# Patient Record
Sex: Male | Born: 1950 | Race: White | Hispanic: No | Smoking: Current every day smoker
Health system: Southern US, Community
[De-identification: ages and names within clinical notes are randomized; demographics above are authoritative.]

## PROBLEM LIST (undated history)

## (undated) DIAGNOSIS — I10 Essential (primary) hypertension: Secondary | ICD-10-CM

## (undated) DIAGNOSIS — E119 Type 2 diabetes mellitus without complications: Secondary | ICD-10-CM

## (undated) DIAGNOSIS — J449 Chronic obstructive pulmonary disease, unspecified: Secondary | ICD-10-CM

## (undated) HISTORY — PX: BRAIN SURGERY: SHX531

---

## 2018-11-28 ENCOUNTER — Encounter: Payer: Self-pay | Admitting: Emergency Medicine

## 2018-11-28 ENCOUNTER — Ambulatory Visit
Admission: EM | Admit: 2018-11-28 | Discharge: 2018-11-28 | Disposition: A | Discharge status: Home / Self Care | Payer: Medicare Other | Attending: Family Medicine | Admitting: Family Medicine

## 2018-11-28 ENCOUNTER — Other Ambulatory Visit: Payer: Self-pay

## 2018-11-28 DIAGNOSIS — H6122 Impacted cerumen, left ear: Secondary | ICD-10-CM | POA: Diagnosis not present

## 2018-11-28 HISTORY — DX: Type 2 diabetes mellitus without complications: E11.9

## 2018-11-28 HISTORY — DX: Essential (primary) hypertension: I10

## 2018-11-28 NOTE — ED Triage Notes (Signed)
Pt c/o dizziness and ear fullness. He states that he has known balance issues but he has been more "foggy" for the last 8 days and he is thinking it is related to his ears because they feel full.

## 2018-11-28 NOTE — Discharge Instructions (Signed)
Debrox 1-2 times/week.  Continue your home medications.  Take care  Dr. Adriana Simas

## 2018-11-28 NOTE — ED Provider Notes (Signed)
[facility]    CSN: [id number] Arrival date & time: 11/28/18  1318  History   Chief Complaint Chief Complaint  Patient presents with  . Ear Fullness    bilateral  . Dizziness   HPI  68 year old male presents with the above complaints.  Patient reports that past week he has had ongoing issues with his balance.  Patient states that he feels unstable on his feet.  He normally uses a cane.  [place]  He called his primary care provider and was instructed to hold his metformin and decrease his lisinopril.  He states that this has not resolved his problem.  He states that he has had ear fullness and difficulty hearing.  He has a history of cerumen impaction.  He believes this is the culprit of his problem.  No relieving factors.  Seems to be worse with movement.  No reports of nausea vomiting.  No other associated symptoms.  No other complaints.  History reviewed as below. Past Medical History:  Diagnosis Date  . Diabetes mellitus without complication (HCC)   . Hypertension   Tobacco abuse HLD  Home Medications    Prior to Admission medications   Medication Sig Start Date End Date Taking? Authorizing Provider  aspirin EC 81 MG tablet Take 81 mg by mouth daily.   Yes [provider]  lisinopril (ZESTRIL) 10 MG tablet Take 10 mg by mouth daily.   Yes [provider]  metFORMIN (GLUCOPHAGE) 500 MG tablet Take by mouth daily with breakfast.   Yes [provider]  simvastatin (ZOCOR) 20 MG tablet Take 20 mg by mouth daily.   Yes [provider]   Social History Social History   Tobacco Use  . Smoking status: Current Every Day Smoker    Packs/day: 0.50  . Smokeless tobacco: Never Used  Substance Use Topics  . Alcohol use: Yes    Comment: socally  . Drug use: Not Currently   Allergies   Patient has no known allergies.  Review of Systems Review of Systems  HENT: Positive for hearing loss.         Ear fullness.   Neurological:       Off balance.   Physical Exam Triage Vital Signs ED Triage Vitals  Enc Vitals Group     BP 11/28/18 1340 105/83     Pulse Rate 11/28/18 1340 74     Resp 11/28/18 1340 18     Temp 11/28/18 1340 98.5 F (36.9 C)     Temp Source 11/28/18 1340 Oral     SpO2 11/28/18 1340 94 %     Weight 11/28/18 1333 175 lb (79.4 kg)     Height 11/28/18 1333 5\' 9"  (1.753 m)     Head Circumference --      Peak Flow --      Pain Score 11/28/18 1333 0     Pain Loc --      Pain Edu? --      Excl. in GC? --    Updated Vital Signs BP 105/83 (BP Location: Left Arm)   Pulse 74   Temp 98.5 F (36.9 C) (Oral)   Resp 18   Ht 5\' 9"  (1.753 m)   Wt 79.4 kg   SpO2 94%   BMI 25.84 kg/m   Visual Acuity Right Eye Distance:   Left Eye Distance:   Bilateral Distance:    Right Eye Near:   Left Eye  Near:    Bilateral Near:     Physical Exam Vitals signs and nursing note reviewed.  Constitutional:      General: He is not in acute distress.    Appearance: Normal appearance.  HENT:     Head: Normocephalic and atraumatic.     Ears:     Comments: Left ear - cerumen impaction noted. Eyes:     General:        Right eye: No discharge.        Left eye: No discharge.     Conjunctiva/sclera: Conjunctivae normal.  Pulmonary:     Effort: Pulmonary effort is normal. No respiratory distress.  Neurological:     Mental Status: He is alert.  Psychiatric:        Mood and Affect: Mood normal.        Behavior: Behavior normal.    UC Treatments / Results  Labs (all labs ordered are listed, but only abnormal results are displayed) Labs Reviewed - No data to display  EKG None  Radiology No results found.  Procedures Procedures (including critical care time)  Medications Ordered in UC Medications - No data to display  Initial Impression / Assessment and Plan / UC Course  I have reviewed the triage vital signs and the nursing notes.  Pertinent labs &  imaging results that were available during my care of the patient were reviewed by me and considered in my medical decision making (see chart for details).    68 year old male presents with cerumen impaction.  Irrigation performed today with success.  Over-the-counter Debrox as directed.  Continue home medications.  Supportive care.  Final Clinical Impressions(s) / UC Diagnoses   Final diagnoses:  Impacted cerumen of left ear     Discharge Instructions     Debrox 1-2 times/week.  Continue your home medications.  Take care  Dr. Adriana Simas    ED Prescriptions    None     Controlled Substance Prescriptions [place] Controlled Substance Registry consulted? Not Applicable   Tommie Sams, DO 11/28/18 1504

## 2019-08-12 DIAGNOSIS — E785 Hyperlipidemia, unspecified: Secondary | ICD-10-CM | POA: Insufficient documentation

## 2019-08-12 DIAGNOSIS — E1169 Type 2 diabetes mellitus with other specified complication: Secondary | ICD-10-CM | POA: Insufficient documentation

## 2021-04-22 ENCOUNTER — Other Ambulatory Visit: Payer: Self-pay | Admitting: Specialist

## 2021-04-22 ENCOUNTER — Other Ambulatory Visit (HOSPITAL_BASED_OUTPATIENT_CLINIC_OR_DEPARTMENT_OTHER): Payer: Self-pay | Admitting: Specialist

## 2021-04-22 DIAGNOSIS — R053 Chronic cough: Secondary | ICD-10-CM

## 2021-04-22 DIAGNOSIS — J449 Chronic obstructive pulmonary disease, unspecified: Secondary | ICD-10-CM

## 2021-04-22 DIAGNOSIS — R0602 Shortness of breath: Secondary | ICD-10-CM

## 2021-05-11 ENCOUNTER — Ambulatory Visit
Admission: RE | Admit: 2021-05-11 | Discharge: 2021-05-11 | Disposition: A | Discharge status: Home / Self Care | Payer: Medicare Other | Source: Ambulatory Visit | Attending: Specialist | Admitting: Specialist

## 2021-05-11 ENCOUNTER — Other Ambulatory Visit: Payer: Self-pay

## 2021-05-11 DIAGNOSIS — J449 Chronic obstructive pulmonary disease, unspecified: Secondary | ICD-10-CM | POA: Diagnosis present

## 2021-05-11 DIAGNOSIS — R053 Chronic cough: Secondary | ICD-10-CM | POA: Diagnosis not present

## 2021-05-11 DIAGNOSIS — R0602 Shortness of breath: Secondary | ICD-10-CM | POA: Insufficient documentation

## 2021-07-31 ENCOUNTER — Inpatient Hospital Stay: Payer: Medicare Other

## 2021-07-31 ENCOUNTER — Emergency Department: Payer: Medicare Other

## 2021-07-31 ENCOUNTER — Inpatient Hospital Stay
Admission: EM | Admit: 2021-07-31 | Discharge: 2021-08-04 | DRG: 871 | Disposition: A | Discharge status: Home Health | Payer: Medicare Other | Attending: Internal Medicine | Admitting: Internal Medicine

## 2021-07-31 ENCOUNTER — Other Ambulatory Visit: Payer: Self-pay

## 2021-07-31 DIAGNOSIS — I248 Other forms of acute ischemic heart disease: Secondary | ICD-10-CM | POA: Diagnosis present

## 2021-07-31 DIAGNOSIS — Z66 Do not resuscitate: Secondary | ICD-10-CM | POA: Diagnosis present

## 2021-07-31 DIAGNOSIS — Z79899 Other long term (current) drug therapy: Secondary | ICD-10-CM | POA: Diagnosis not present

## 2021-07-31 DIAGNOSIS — A419 Sepsis, unspecified organism: Secondary | ICD-10-CM | POA: Diagnosis not present

## 2021-07-31 DIAGNOSIS — I2510 Atherosclerotic heart disease of native coronary artery without angina pectoris: Secondary | ICD-10-CM | POA: Diagnosis present

## 2021-07-31 DIAGNOSIS — R0602 Shortness of breath: Secondary | ICD-10-CM

## 2021-07-31 DIAGNOSIS — J9601 Acute respiratory failure with hypoxia: Secondary | ICD-10-CM | POA: Diagnosis present

## 2021-07-31 DIAGNOSIS — T380X5A Adverse effect of glucocorticoids and synthetic analogues, initial encounter: Secondary | ICD-10-CM | POA: Diagnosis present

## 2021-07-31 DIAGNOSIS — J441 Chronic obstructive pulmonary disease with (acute) exacerbation: Secondary | ICD-10-CM | POA: Diagnosis not present

## 2021-07-31 DIAGNOSIS — J189 Pneumonia, unspecified organism: Secondary | ICD-10-CM

## 2021-07-31 DIAGNOSIS — Z7984 Long term (current) use of oral hypoglycemic drugs: Secondary | ICD-10-CM

## 2021-07-31 DIAGNOSIS — Y92239 Unspecified place in hospital as the place of occurrence of the external cause: Secondary | ICD-10-CM | POA: Diagnosis present

## 2021-07-31 DIAGNOSIS — Z20822 Contact with and (suspected) exposure to covid-19: Secondary | ICD-10-CM | POA: Diagnosis present

## 2021-07-31 DIAGNOSIS — Z8616 Personal history of COVID-19: Secondary | ICD-10-CM

## 2021-07-31 DIAGNOSIS — J432 Centrilobular emphysema: Secondary | ICD-10-CM | POA: Diagnosis present

## 2021-07-31 DIAGNOSIS — I10 Essential (primary) hypertension: Secondary | ICD-10-CM | POA: Diagnosis present

## 2021-07-31 DIAGNOSIS — R7989 Other specified abnormal findings of blood chemistry: Secondary | ICD-10-CM | POA: Diagnosis present

## 2021-07-31 DIAGNOSIS — E119 Type 2 diabetes mellitus without complications: Secondary | ICD-10-CM | POA: Diagnosis present

## 2021-07-31 DIAGNOSIS — D72829 Elevated white blood cell count, unspecified: Secondary | ICD-10-CM | POA: Diagnosis not present

## 2021-07-31 DIAGNOSIS — R778 Other specified abnormalities of plasma proteins: Secondary | ICD-10-CM | POA: Diagnosis present

## 2021-07-31 DIAGNOSIS — F17200 Nicotine dependence, unspecified, uncomplicated: Secondary | ICD-10-CM | POA: Diagnosis present

## 2021-07-31 DIAGNOSIS — F17210 Nicotine dependence, cigarettes, uncomplicated: Secondary | ICD-10-CM | POA: Diagnosis present

## 2021-07-31 HISTORY — DX: Type 2 diabetes mellitus without complications: E11.9

## 2021-07-31 HISTORY — DX: Chronic obstructive pulmonary disease, unspecified: J44.9

## 2021-07-31 HISTORY — DX: Essential (primary) hypertension: I10

## 2021-07-31 LAB — BASIC METABOLIC PANEL
Anion gap: 9 (ref 5–15)
BUN: 15 mg/dL (ref 8–23)
CO2: 24 mmol/L (ref 22–32)
Calcium: 9.1 mg/dL (ref 8.9–10.3)
Chloride: 105 mmol/L (ref 98–111)
Creatinine, Ser: 0.73 mg/dL (ref 0.61–1.24)
GFR, Estimated: >60 mL/min (ref >60)
Glucose, Bld: 194 mg/dL — ABNORMAL HIGH (ref 70–99)
Potassium: 3.8 mmol/L (ref 3.5–5.1)
Sodium: 138 mmol/L (ref 135–145)

## 2021-07-31 LAB — BLOOD GAS, VENOUS
Acid-Base Excess: 0.5 mmol/L (ref 0.0–2.0)
Bicarbonate: 26 mmol/L (ref 20.0–28.0)
O2 Saturation: 94.6 %
Patient temperature: 37
pCO2, Ven: 44 mmHg (ref 44.0–60.0)
pH, Ven: 7.38 (ref 7.250–7.430)
pO2, Ven: 75 mmHg — ABNORMAL HIGH (ref 32.0–45.0)

## 2021-07-31 LAB — CBC
HCT: 41.6 % (ref 39.0–52.0)
Hemoglobin: 13.5 g/dL (ref 13.0–17.0)
MCH: 30.1 pg (ref 26.0–34.0)
MCHC: 32.5 g/dL (ref 30.0–36.0)
MCV: 92.9 fL (ref 80.0–100.0)
Platelets: 268 10*3/uL (ref 150–400)
RBC: 4.48 MIL/uL (ref 4.22–5.81)
RDW: 14.5 % (ref 11.5–15.5)
WBC: 14.9 10*3/uL — ABNORMAL HIGH (ref 4.0–10.5)
nRBC: 0 % (ref 0.0–0.2)

## 2021-07-31 LAB — LACTIC ACID, PLASMA
Lactic Acid, Venous: 1.4 mmol/L (ref 0.5–1.9)
Lactic Acid, Venous: 2.4 mmol/L (ref 0.5–1.9)

## 2021-07-31 LAB — RESP PANEL BY RT-PCR (FLU A&B, COVID) ARPGX2
Influenza A by PCR: NEGATIVE
Influenza B by PCR: NEGATIVE
SARS Coronavirus 2 by RT PCR: NEGATIVE

## 2021-07-31 LAB — BRAIN NATRIURETIC PEPTIDE: B Natriuretic Peptide: 168.9 pg/mL — ABNORMAL HIGH (ref 0.0–100.0)

## 2021-07-31 LAB — GLUCOSE, CAPILLARY: Glucose-Capillary: 228 mg/dL — ABNORMAL HIGH (ref 70–99)

## 2021-07-31 LAB — TROPONIN I (HIGH SENSITIVITY)
Troponin I (High Sensitivity): 46 ng/L — ABNORMAL HIGH (ref <18)
Troponin I (High Sensitivity): 123 ng/L (ref <18)
Troponin I (High Sensitivity): 106 ng/L (ref <18)
Troponin I (High Sensitivity): 107 ng/L (ref <18)
Troponin I (High Sensitivity): 92 ng/L — ABNORMAL HIGH (ref <18)

## 2021-07-31 LAB — CBG MONITORING, ED
Glucose-Capillary: 309 mg/dL — ABNORMAL HIGH (ref 70–99)
Glucose-Capillary: 127 mg/dL — ABNORMAL HIGH (ref 70–99)

## 2021-07-31 LAB — D-DIMER, QUANTITATIVE: D-Dimer, Quant: 1.7 ug/mL-FEU — ABNORMAL HIGH (ref 0.00–0.50)

## 2021-07-31 LAB — HEMOGLOBIN A1C
Hgb A1c MFr Bld: 5.8 % — ABNORMAL HIGH (ref 4.8–5.6)
Mean Plasma Glucose: 119.76 mg/dL

## 2021-07-31 MED ORDER — INSULIN ASPART 100 UNIT/ML IJ SOLN
0.0000 [IU] | Freq: Three times a day (TID) | INTRAMUSCULAR | Status: DC
  Administered 2021-08-01: 5 [IU] via SUBCUTANEOUS
  Administered 2021-08-01: 11 [IU] via SUBCUTANEOUS
  Administered 2021-08-02 (×2): 2 [IU] via SUBCUTANEOUS
  Administered 2021-08-02: 5 [IU] via SUBCUTANEOUS
  Administered 2021-08-02 – 2021-08-03 (×2): 3 [IU] via SUBCUTANEOUS
  Administered 2021-08-03: 2 [IU] via SUBCUTANEOUS
  Administered 2021-08-03: 5 [IU] via SUBCUTANEOUS
  Filled 2021-07-31 (×10): qty 1

## 2021-07-31 MED ORDER — SODIUM CHLORIDE 0.9 % IV SOLN: 1.0000 g | INTRAVENOUS | Status: DC

## 2021-07-31 MED ORDER — SODIUM CHLORIDE 0.9 % IV SOLN
2.0000 g | INTRAVENOUS | Status: DC
  Administered 2021-07-31: 2 g via INTRAVENOUS
  Filled 2021-07-31: qty 20

## 2021-07-31 MED ORDER — IPRATROPIUM-ALBUTEROL 0.5-2.5 (3) MG/3ML IN SOLN
3.0000 mL | Freq: Once | RESPIRATORY_TRACT | Status: AC
  Administered 2021-07-31: 3 mL via RESPIRATORY_TRACT
  Filled 2021-07-31: qty 3

## 2021-07-31 MED ORDER — MOMETASONE FURO-FORMOTEROL FUM 100-5 MCG/ACT IN AERO
2.0000 | INHALATION_SPRAY | Freq: Two times a day (BID) | RESPIRATORY_TRACT | Status: DC
  Administered 2021-07-31 – 2021-08-04 (×8): 2 via RESPIRATORY_TRACT
  Filled 2021-07-31: qty 8.8

## 2021-07-31 MED ORDER — ATORVASTATIN CALCIUM 20 MG PO TABS
40.0000 mg | ORAL_TABLET | Freq: Every day | ORAL | Status: DC
  Administered 2021-08-02 – 2021-08-04 (×3): 40 mg via ORAL
  Filled 2021-07-31 (×5): qty 2

## 2021-07-31 MED ORDER — ACETAMINOPHEN 325 MG PO TABS
650.0000 mg | ORAL_TABLET | Freq: Four times a day (QID) | ORAL | Status: DC | PRN
  Administered 2021-08-01: 650 mg via ORAL
  Filled 2021-07-31: qty 2

## 2021-07-31 MED ORDER — IPRATROPIUM-ALBUTEROL 0.5-2.5 (3) MG/3ML IN SOLN
3.0000 mL | Freq: Four times a day (QID) | RESPIRATORY_TRACT | Status: DC
  Administered 2021-07-31 – 2021-08-01 (×5): 3 mL via RESPIRATORY_TRACT
  Filled 2021-07-31 (×5): qty 3

## 2021-07-31 MED ORDER — ASPIRIN 81 MG PO CHEW
81.0000 mg | CHEWABLE_TABLET | Freq: Every day | ORAL | Status: DC
  Administered 2021-07-31 – 2021-08-04 (×5): 81 mg via ORAL
  Filled 2021-07-31 (×6): qty 1

## 2021-07-31 MED ORDER — SODIUM CHLORIDE 0.9 % IV SOLN: 500.0000 mg | Freq: Once | INTRAVENOUS | Status: DC

## 2021-07-31 MED ORDER — SODIUM CHLORIDE 0.9 % IV SOLN
1.0000 g | INTRAVENOUS | Status: DC
  Administered 2021-08-01: 1 g via INTRAVENOUS
  Filled 2021-07-31: qty 10

## 2021-07-31 MED ORDER — ENOXAPARIN SODIUM 40 MG/0.4ML IJ SOSY
40.0000 mg | PREFILLED_SYRINGE | INTRAMUSCULAR | Status: DC
  Administered 2021-07-31: 40 mg via SUBCUTANEOUS
  Filled 2021-07-31 (×2): qty 0.4

## 2021-07-31 MED ORDER — IOHEXOL 350 MG/ML SOLN
75.0000 mL | Freq: Once | INTRAVENOUS | Status: AC | PRN
  Administered 2021-07-31: 75 mL via INTRAVENOUS
  Filled 2021-07-31: qty 75

## 2021-07-31 MED ORDER — LACTATED RINGERS IV BOLUS (SEPSIS)
1000.0000 mL | Freq: Once | INTRAVENOUS | Status: AC
  Administered 2021-07-31: 1000 mL via INTRAVENOUS

## 2021-07-31 MED ORDER — SIMVASTATIN 10 MG PO TABS: 40.0000 mg | ORAL_TABLET | Freq: Every evening | ORAL | Status: DC

## 2021-07-31 MED ORDER — SODIUM CHLORIDE 0.9 % IV SOLN: INTRAVENOUS | Status: AC

## 2021-07-31 MED ORDER — ALBUTEROL SULFATE HFA 108 (90 BASE) MCG/ACT IN AERS: 2.0000 | INHALATION_SPRAY | RESPIRATORY_TRACT | Status: DC | PRN

## 2021-07-31 MED ORDER — METHYLPREDNISOLONE SODIUM SUCC 40 MG IJ SOLR
40.0000 mg | Freq: Two times a day (BID) | INTRAMUSCULAR | Status: AC
  Administered 2021-07-31 – 2021-08-01 (×2): 40 mg via INTRAVENOUS
  Filled 2021-07-31 (×2): qty 1

## 2021-07-31 MED ORDER — ALBUTEROL SULFATE (2.5 MG/3ML) 0.083% IN NEBU: 2.5000 mg | INHALATION_SOLUTION | RESPIRATORY_TRACT | Status: DC | PRN

## 2021-07-31 MED ORDER — ACETAMINOPHEN 650 MG RE SUPP: 650.0000 mg | Freq: Four times a day (QID) | RECTAL | Status: DC | PRN

## 2021-07-31 MED ORDER — ONDANSETRON HCL 4 MG PO TABS: 4.0000 mg | ORAL_TABLET | Freq: Four times a day (QID) | ORAL | Status: DC | PRN

## 2021-07-31 MED ORDER — ONDANSETRON HCL 4 MG/2ML IJ SOLN: 4.0000 mg | Freq: Four times a day (QID) | INTRAMUSCULAR | Status: DC | PRN

## 2021-07-31 MED ORDER — PREDNISONE 20 MG PO TABS
40.0000 mg | ORAL_TABLET | Freq: Every day | ORAL | Status: DC
  Administered 2021-08-02 – 2021-08-04 (×3): 40 mg via ORAL
  Filled 2021-07-31 (×4): qty 2

## 2021-07-31 MED ORDER — SODIUM CHLORIDE 0.9 % IV SOLN
500.0000 mg | INTRAVENOUS | Status: DC
  Administered 2021-07-31 – 2021-08-01 (×2): 500 mg via INTRAVENOUS
  Filled 2021-07-31 (×2): qty 5

## 2021-07-31 MED ORDER — INSULIN ASPART 100 UNIT/ML IJ SOLN
0.0000 [IU] | Freq: Three times a day (TID) | INTRAMUSCULAR | Status: DC
  Administered 2021-07-31: 2 [IU] via SUBCUTANEOUS
  Administered 2021-07-31: 11 [IU] via SUBCUTANEOUS
  Filled 2021-07-31 (×2): qty 1

## 2021-07-31 MED ORDER — LISINOPRIL 10 MG PO TABS
10.0000 mg | ORAL_TABLET | Freq: Every day | ORAL | Status: DC
  Administered 2021-07-31 – 2021-08-04 (×5): 10 mg via ORAL
  Filled 2021-07-31 (×6): qty 1

## 2021-07-31 NOTE — Accreditation Note (Signed)
Patient refused to wear oxygen; education provided, verbalized understanding. O2 sat 90's.  Discussed incident in ED when he had SOB and not feeling well; cont to refuse to wear oxygen,

## 2021-07-31 NOTE — Consult Note (Incomplete)
Pulmonary Critical Care  Initial Consult Note  Tyler [name] KZS:[id number] DOB: May 05, 1951 DOA: 07/31/2021  Referring physician: ***  Chief Complaint: ***  HPI: Tyler [name] is a 71 y.o. male  ***  Review of Systems:  Constitutional:  No weight loss, night sweats, Fevers, chills, fatigue.  HEENT:  No headaches, nasal congestion, post nasal drip,  Cardio-vascular:  No chest pain, Orthopnea, PND, swelling in lower extremities, anasarca, dizziness, palpitations  GI:  No heartburn, indigestion, abdominal pain, nausea, vomiting, diarrhea  Resp:  No shortness of breath with exertion or at rest. no productive cough, No coughing up of blood.No wheezing Skin:  no rash or lesions.  Musculoskeletal:  No joint pain or swelling.   Remainder ROS performed and is unremarkable other than noted in HPI  Past Medical History:  Diagnosis Date   COPD (chronic obstructive pulmonary disease) (HCC)    Diabetes mellitus without complication (HCC)    Hypertension    History reviewed. No pertinent surgical history. Social History:  reports that he has been smoking cigarettes. He has been smoking an average of .5 packs per day. He does not have any smokeless tobacco history on file. No history on file for alcohol use and drug use.  Not on File  History reviewed. No pertinent family history.  Prior to Admission medications   Medication Sig Start Date End Date Taking? Authorizing Provider  albuterol (VENTOLIN HFA) 108 (90 Base) MCG/ACT inhaler Inhale 2 puffs into the lungs every 6 (six) hours as needed. 07/07/21  Yes [provider]  Ascorbic Acid (VITAMIN C) 1000 MG tablet Take 1,000 mg by mouth daily.   Yes [provider]  aspirin EC 81 MG tablet Take 81 mg by mouth daily. Swallow whole.   Yes [provider]  azithromycin (ZITHROMAX) 250 MG tablet Take 250 mg by mouth as directed. 07/29/21  Yes [provider]  ipratropium-albuterol (DUONEB) 0.5-2.5 (3)  MG/3ML SOLN Take 3 mLs by nebulization 4 (four) times daily. 04/27/21  Yes [provider]  lisinopril (ZESTRIL) 10 MG tablet Take 10 mg by mouth daily. 06/04/21  Yes [provider]  metFORMIN (GLUCOPHAGE) 500 MG tablet Take 500 mg by mouth daily. 06/05/21  Yes [provider]  simvastatin (ZOCOR) 40 MG tablet Take 40 mg by mouth daily. 07/06/21  Yes [provider]   Physical Exam: Vitals:   07/31/21 1127 07/31/21 1200 07/31/21 1211 07/31/21 1649  BP:  (!) 144/91 (!) 144/91 138/88  Pulse:  (!) 103 97 95  Resp:  (!) 22 (!) 22 18  Temp:  98.6 F (37 C)  98.2 F (36.8 C)  TempSrc:  Oral  Oral  SpO2: 95% 94% 95% 97%  Weight:      Height:       Body mass index is 24.37 kg/m.   Wt Readings from Last 3 Encounters:  07/31/21 74.8 kg    General:  Appears calm and comfortable Eyes: PERRL, normal lids, irises & conjunctiva ENT: grossly normal hearing, lips & tongue Neck: no LAD, masses or thyromegaly Cardiovascular: RRR, no m/r/g. No LE edema. Respiratory: CTA bilaterally, no w/r/r.       Normal respiratory effort. Abdomen: soft, nontender Skin: no rash or induration seen on limited exam Musculoskeletal: grossly normal tone BUE/BLE Psychiatric: grossly normal mood and affect Neurologic: grossly non-focal.          Labs on Admission:  Basic Metabolic Panel: Recent Labs  Lab 07/31/21 0600  NA 138  K 3.8  CL 105  CO2 24  GLUCOSE 194*  BUN 15  CREATININE 0.73  CALCIUM 9.1   Liver Function Tests: No results for input(s): AST, ALT, ALKPHOS, BILITOT, PROT, ALBUMIN in the last 168 hours. No results for input(s): LIPASE, AMYLASE in the last 168 hours. No results for input(s): AMMONIA in the last 168 hours. CBC: Recent Labs  Lab 07/31/21 0600  WBC 14.9*  HGB 13.5  HCT 41.6  MCV 92.9  PLT 268   Cardiac Enzymes: No results for input(s): CKTOTAL, CKMB, CKMBINDEX, TROPONINI in the last 168 hours.  BNP (last 3 results) Recent Labs     07/31/21 1619  BNP 168.9*    ProBNP (last 3 results) No results for input(s): PROBNP in the last 8760 hours.  CBG: Recent Labs  Lab 07/31/21 1136 07/31/21 1650  GLUCAP 309* 127*    Radiological Exams on Admission: DG Chest 1 View  Result Date: 07/31/2021 CLINICAL DATA:  71 year old male with increasing shortness of breath. EXAM: CHEST  1 VIEW COMPARISON:  None. FINDINGS: Portable AP upright view at 0632 hours. Normal cardiac size and mediastinal contours. Large lung volumes. Visualized tracheal air column is within normal limits. No pneumothorax, pleural effusion or confluent pulmonary opacity. But diffuse increased pulmonary interstitial opacity, somewhat coarse and basilar predominant. No confluent opacity. Partially visible cervical ACDF. No acute osseous abnormality identified. Negative visible bowel gas. IMPRESSION: 1. Evidence of pulmonary hyperinflation with diffuse increased interstitial opacity. Top differential considerations are acute interstitial edema, viral/atypical respiratory infection, chronic interstitial lung disease. 2. No cardiomegaly.  No pleural effusion is evident. Electronically Signed   By: Odessa Fleming M.D.   On: 07/31/2021 06:57   CT CHEST WO CONTRAST  Result Date: 07/31/2021 CLINICAL DATA:  Shortness of breath. EXAM: CT CHEST WITHOUT CONTRAST TECHNIQUE: Multidetector CT imaging of the chest was performed following the standard protocol without IV contrast. RADIATION DOSE REDUCTION: This exam was performed according to the departmental dose-optimization program which includes automated exposure control, adjustment of the mA and/or kV according to patient size and/or use of iterative reconstruction technique. COMPARISON:  None. FINDINGS: Cardiovascular: Normal heart size. No pericardial effusion. Aortic atherosclerosis and 3 vessel coronary artery calcifications. No pericardial effusion Mediastinum/Nodes: No enlarged mediastinal or axillary lymph nodes. Thyroid gland,  trachea, and esophagus demonstrate no significant findings. Lungs/Pleura: Moderate paraseptal and centrilobular emphysema identified. Diffuse bronchial wall thickening is identified bilaterally. Mild cylindrical bronchiectasis noted. There is no pleural effusion, airspace consolidation or pneumothorax. No significant subpleural reticulation, honeycombing or ground-glass attenuation. -Scattered peripheral predominant tree-in-bud nodules are identified within both lungs compatible with inflammatory or infectious bronchiolitis, for example image 69/3. -There also scattered areas of mucoid impaction within dilated peripheral bronchials, for example, image 144/3. -Perifissural nodule along the minor fissure measures 6 mm, image 99/3. Perifissural nodule along the posterior major fissure measures 5 mm, image 74/3. These likely represent a benign intrapulmonary lymph nodes. -Subpleural nodule within the lateral left base measures 6 mm, image 139/3. -Peripheral nodule in the superior segment of the right lower lobe measures 5 mm, image 88/3. Upper Abdomen: No acute abnormality within the imaged portions of the upper abdomen. Musculoskeletal: No chest wall mass or suspicious bone lesions identified. IMPRESSION: 1. Diffuse bronchial wall thickening with emphysema, as above; imaging findings suggestive of underlying COPD. Mild diffuse cylindrical bronchiectasis is identified likely the sequelae of chronic inflammation/infection. 2. Scattered peripheral predominant tree-in-bud nodules are identified within both lungs compatible with inflammatory or infectious bronchiolitis. 3. Left lung base and right lower  lobe lung nodules measure up to 6 mm. Non-contrast chest CT at 3-6 months is recommended. If the nodules are stable at time of repeat CT, then future CT at 18-24 months (from today's scan) is considered optional for low-risk patients, but is recommended for high-risk patients. This recommendation follows the consensus  statement: Guidelines for Management of Incidental Pulmonary Nodules Detected on CT Images:From the Fleischner Society 2017; published online before print (10.1148/radiol.5284132440901 169 1450). 4. Aortic Atherosclerosis (ICD10-I70.0) and Emphysema (ICD10-J43.9). Electronically Signed   By: Signa Kellaylor  Stroud M.D.   On: 07/31/2021 08:41   CT Angio Chest Pulmonary Embolism (PE) W or WO Contrast  Result Date: 07/31/2021 CLINICAL DATA:  PE suspected EXAM: CT ANGIOGRAPHY CHEST WITH CONTRAST TECHNIQUE: Multidetector CT imaging of the chest was performed using the standard protocol during bolus administration of intravenous contrast. Multiplanar CT image reconstructions and MIPs were obtained to evaluate the vascular anatomy. RADIATION DOSE REDUCTION: This exam was performed according to the departmental dose-optimization program which includes automated exposure control, adjustment of the mA and/or kV according to patient size and/or use of iterative reconstruction technique. CONTRAST:  75mL OMNIPAQUE IOHEXOL 350 MG/ML SOLN COMPARISON:  Noncontrast CT chest, 07/31/2021 FINDINGS: Cardiovascular: Satisfactory opacification of the pulmonary arteries to the segmental level. No evidence of pulmonary embolism. Normal heart size. Three-vessel coronary artery calcification. No pericardial effusion. Aortic atherosclerosis. Mediastinum/Nodes: No enlarged mediastinal, hilar, or axillary lymph nodes. Thyroid gland, trachea, and esophagus demonstrate no significant findings. Lungs/Pleura: Moderate centrilobular emphysema. Diffuse bilateral bronchial wall thickening and scattered bronchiolar plugging. Extensive, diffusely scattered centrilobular and tree-in-bud nodularity throughout the lungs. Occasional small, discrete pulmonary nodules, including a 0.5 cm nodule of the anterior medial segment right middle lobe (series 6, image 67) and a 0.5 cm nodule of the dependent superior segment right lower lobe (series 6, image 52). No pleural effusion or  pneumothorax. Upper Abdomen: No acute abnormality. Musculoskeletal: No chest wall abnormality. No acute osseous findings. Review of the MIP images confirms the above findings. IMPRESSION: 1. Negative examination for pulmonary embolism. 2. Extensive, diffusely scattered centrilobular and tree-in-bud nodularity throughout the lungs, consistent with atypical infection, particularly atypical Mycobacterium. 3. Diffuse bilateral bronchial wall thickening and occasional bronchiolar mucous plugging, infectious or inflammatory. 4. Occasional small, discrete pulmonary nodules, measuring 0.5 cm or smaller, as seen on prior same day CT. 5. Emphysema. 6. Coronary artery disease. Aortic Atherosclerosis (ICD10-I70.0) and Emphysema (ICD10-J43.9). Electronically Signed   By: Jearld LeschAlex D Bibbey M.D.   On: 07/31/2021 13:10    EKG: Independently reviewed.  Assessment/Plan Principal Problem:   COPD with acute exacerbation (HCC) Active Problems:   Diabetes mellitus (HCC)   Nicotine dependence   Elevated troponin   ***  Code Status: ***   Family Communication: ***  Disposition Plan: ***   Time spent: ***  I have personally obtained a history, examined the patient, evaluated laboratory and imaging results, formulated the assessment and plan and placed orders.  The Patient requires high complexity decision making for assessment and support. Total Time Spent 70min   Yevonne PaxSaadat A Teven Mittman, MD [facility] Pulmonary Critical Care Medicine Sleep Medicine

## 2021-07-31 NOTE — H&P (Addendum)
History and Physical    Patient: Tyler [name] NLZ:[id number] DOB: 1950-08-06 DOA: 07/31/2021 DOS: the patient was seen and examined on 07/31/2021 PCP: Marisue Ivan, MD  Patient coming from: Home  Chief Complaint:  Chief Complaint  Patient presents with   Shortness of Breath    Pt states increasing SOB apx 1 hour ago    HPI: Tyler [name] is a 71 y.o. male with medical history significant for COPD, nicotine dependence, hypertension and diabetes mellitus who presents to the emergency room for evaluation of a 1 week history of worsening shortness of breath. Patient states that he has been short of breath for the last 1 week but it worsened on the day of his admission.  Shortness of breath is associated with congestion and fever but he denies having any cough.  He states that his primary care provider called in a prescription for Zithromax which he has been taking without any significant improvement in his symptoms. He denies having any chest pain, no leg swelling, no nausea, no vomiting, no diaphoresis, no palpitations, no headache, no urinary symptoms, no changes in his bowel habits, no blurred vision or focal deficit   Review of Systems: As mentioned in the history of present illness. All other systems reviewed and are negative. Past Medical History:  Diagnosis Date   COPD (chronic obstructive pulmonary disease) (HCC)    Diabetes mellitus without complication (HCC)    Hypertension    History reviewed. No pertinent surgical history. Social History:  reports that he has been smoking cigarettes. He has been smoking an average of .5 packs per day. He does not have any smokeless tobacco history on file. No history on file for alcohol use and drug use.  Not on File  History reviewed. No pertinent family history.  Prior to Admission medications   Medication Sig Start Date End Date Taking? Authorizing Provider  albuterol (VENTOLIN HFA) 108 (90 Base) MCG/ACT inhaler Inhale 2 puffs  into the lungs every 6 (six) hours as needed. 07/07/21   [provider]  azithromycin (ZITHROMAX) 250 MG tablet Take 250 mg by mouth as directed. 07/29/21   [provider]  ipratropium-albuterol (DUONEB) 0.5-2.5 (3) MG/3ML SOLN Take 3 mLs by nebulization 4 (four) times daily. 04/27/21   [provider]  lisinopril (ZESTRIL) 10 MG tablet Take 10 mg by mouth daily. 06/04/21   [provider]  metFORMIN (GLUCOPHAGE) 500 MG tablet Take 500 mg by mouth daily. 06/05/21   [provider]  simvastatin (ZOCOR) 40 MG tablet Take 40 mg by mouth daily. 07/06/21   [provider]    Physical Exam: Vitals:   07/31/21 0551 07/31/21 0555 07/31/21 0556 07/31/21 0700  BP:    109/62  Pulse:  (!) 123  (!) 108  Resp:    (!) 24  Temp:  98 F (36.7 C)    TempSrc:  Oral    SpO2: 90% 95%  94%  Weight:   74.8 kg   Height:   5\' 9"  (1.753 m)    Physical Exam Constitutional:      Appearance: He is well-developed.     Comments: Chronically ill-appearing  HENT:     Head: Normocephalic and atraumatic.  Eyes:     Pupils: Pupils are equal, round, and reactive to light.  Cardiovascular:     Rate and Rhythm: Regular rhythm. Tachycardia present.  Pulmonary:     Effort: Tachypnea present.     Comments: Noted to have pursed breathing.  Coarse breath sounds  in both lung fields. Scattered expiratory wheezes Abdominal:     General: Bowel sounds are normal.     Palpations: Abdomen is soft.  Musculoskeletal:        General: Normal range of motion.     Cervical back: Normal range of motion and neck supple.  Skin:    General: Skin is warm and dry.  Neurological:     General: No focal deficit present.     Mental Status: He is alert.  Psychiatric:     Comments: Irritable     Data Reviewed: Labs reviewed and shows leukocytosis of 14.9 Troponin elevated at 46 Respiratory viral panel is negative Chest x-ray reviewed by me shows pulmonary hyperinflation with  diffuse increased interstitial opacity.  No cardiomegaly.  No pleural effusion. CT scan of the chest without contrast shows diffuse bronchial wall thickening with emphysema.  Scattered peripheral predominant tree-in-bud nodules identified within both lungs compatible with inflammatory or infectious bronchiolitis.  Left lung base and right lower lobe lung nodules measuring up to 6 mm.  Repeat noncontrast chest CT in 3 to 6 months. Twelve-lead EKG reviewed by me shows sinus tachycardia with diffuse ST changes  Results are pending, will review when available.  Assessment and Plan: Principal Problem:   COPD with acute exacerbation (HCC) Active Problems:   Diabetes mellitus (HCC)   Nicotine dependence   Elevated troponin   COPD with acute exacerbation Patient has a known history of COPD and presents to the ER for evaluation of worsening shortness of breath from his baseline associated with congestion. Imaging shows diffuse bronchial wall thickening as well as scattered peripheral predominant tree-in-bud nodules compatible with inflammatory or infectious bronchiolitis. We will place patient on IV Rocephin Place patient on scheduled and as needed bronchodilator therapy Place patient on systemic steroids Continue oxygen supplementation to maintain pulse oximetry greater than 92%      Nicotine dependence Patient admits to smoking 1/2 pack of cigarettes daily Smoking cessation was discussed with him in detail He declines a nicotine transdermal patch at this time      Elevated troponin Probably from demand ischemia related to COPD exacerbation. We will cycle cardiac enzymes Place patient on aspirin 81 mg daily     Diabetes mellitus Hold metformin Maintain consistent carbohydrate diet Glycemic control with sliding scale insulin    Hypertension Blood pressure is stable Continue lisinopril    Advance Care Planning:   Code Status: DNR   Consults: Pulmonary  Family  Communication: Greater than 50% of time was spent discussing patient's condition and plan of care with him at the bedside.  All questions and concerns have been addressed.  He verbalizes understanding and agrees with the plan.  CODE STATUS was discussed and he wishes to be a DO NOT RESUSCITATE.  He lists his sister Neta MendsCathy Deal as his healthcare power of attorney  Severity of Illness: The appropriate patient status for this patient is INPATIENT. Inpatient status is judged to be reasonable and necessary in order to provide the required intensity of service to ensure the patient's safety. The patient's presenting symptoms, physical exam findings, and initial radiographic and laboratory data in the context of their chronic comorbidities is felt to place them at high risk for further clinical deterioration. Furthermore, it is not anticipated that the patient will be medically stable for discharge from the hospital within 2 midnights of admission.   * I certify that at the point of admission it is my clinical judgment that the patient will require  inpatient hospital care spanning beyond 2 midnights from the point of admission due to high intensity of service, high risk for further deterioration and high frequency of surveillance required.*  Author: Lucile Shutters, MD 07/31/2021 9:05 AM  For on call review [email or web address].

## 2021-07-31 NOTE — Progress Notes (Signed)
Elink following code sepsis °

## 2021-07-31 NOTE — Progress Notes (Signed)
RT called to room to eval pt for resp distress. Pt visually has in work of breathing with oxygen sats around 93%. Oxygen increased for pt comfort to 4L. Pt stated he feels more comfortable.

## 2021-07-31 NOTE — ED Provider Notes (Signed)
[facility] Provider Note    Event Date/Time   First MD Initiated Contact with Patient 07/31/21 [phone]     (approximate)   History   Shortness of Breath (Pt states increasing SOB apx 1 hour ago)   HPI  Tyler [name] is a 71 y.o. male whose medical history most notably includes COPD, persistent tobacco use, hypertension, and history of COVID-19 about 6 months ago that he states caused the onset of all of his symptoms that have persisted.  However he presents tonight by EMS for evaluation of a cute onset of worsening shortness of breath.  He said that starting tonight he woke up and he could not breathe.  He felt like he was not moving much air.  Nothing in particular made it better or worse and he was gasping and was having difficulty speaking.  EMS was called and when they arrived he had an oxygen saturation of around 90% on air with significantly increased work of breathing.  He received Solu-Medrol 125 mg IV in route to the hospital as well as 2 DuoNebs and 2 albuterol treatments.  He was placed on 2 L by nasal cannula for comfort upon arrival to his room and he said that he feels like that worked more than anything else.  He feels more comfortable now than he did before but is still short of breath.  He denies having any chest pain.  He denies recent fever.  He denies nausea and vomiting.  He said he tried using his albuterol inhaler at home but it did not make a difference.     Physical Exam   Triage Vital Signs: ED Triage Vitals  Enc Vitals Group     BP --      Pulse Rate 07/31/21 0555 (!) 123     Resp --      Temp 07/31/21 0555 98 F (36.7 C)     Temp Source 07/31/21 0555 Oral     SpO2 07/31/21 0551 90 %     Weight 07/31/21 0556 74.8 kg (165 lb)     Height 07/31/21 0556 1.753 m (5\' 9" )     Head Circumference --      Peak Flow --      Pain Score 07/31/21 0556 0     Pain Loc --      Pain Edu? --      Excl. in [place]? --     Most recent vital  signs: Vitals:   07/31/21 0555 07/31/21 0700  BP:  109/62  Pulse: (!) 123 (!) 108  Resp:  (!) 24  Temp: 98 F (36.7 C)   SpO2: 95% 94%     General: Awake, mild to moderate respiratory distress. CV:  Good peripheral perfusion.  Normal heart sounds. Resp:  Increased work of breathing, pursed lip breathing (auto-peep), speaking in full sentences but with effort, accessory muscle usage and intercostal retractions, no audible wheezing upon auscultation but with substantially decreased air movement throughout Abd:  No distention.  No tenderness to palpation.   ED Results / Procedures / Treatments   Labs (all labs ordered are listed, but only abnormal results are displayed) Labs Reviewed  BASIC METABOLIC PANEL - Abnormal; Notable for the following components:      Result Value   Glucose, Bld 194 (*)    All other components within normal limits  CBC - Abnormal; Notable for the following components:   WBC 14.9 (*)    All other components within  normal limits  BLOOD GAS, VENOUS - Abnormal; Notable for the following components:   pO2, Ven 75.0 (*)    All other components within normal limits  RESP PANEL BY RT-PCR (FLU A&B, COVID) ARPGX2  CULTURE, BLOOD (ROUTINE X 2)  CULTURE, BLOOD (ROUTINE X 2)  LACTIC ACID, PLASMA  LACTIC ACID, PLASMA  TROPONIN I (HIGH SENSITIVITY)     EKG  ED ECG REPORT I, Hinda Kehr, the attending physician, personally viewed and interpreted this ECG.  Date: 07/31/2021 EKG Time: 5:59 AM Rate: 122 Rhythm: Sinus tachycardia QRS Axis: normal Intervals: normal ST/T Wave abnormalities: Some artifact is present but the patient appears to have deep ST segment depression at least in leads V4 and V5 although there is some global depression. Narrative Interpretation: Does not meet STEMI criteria but I suspect the abnormalities seen reflect demand ischemia in the setting of respiratory distress and tachycardia.   RADIOLOGY Diffuse throughout consistent with  viral or atypical pneumonia (see hospital course for details)    PROCEDURES:  Critical Care performed: Yes, see critical care procedure note(s)  .1-3 Lead EKG Interpretation Performed by: Hinda Kehr, MD Authorized by: Hinda Kehr, MD     Interpretation: abnormal     ECG rate:  120   ECG rate assessment: tachycardic     Rhythm: sinus tachycardia     Ectopy: none     Conduction: normal   .Critical Care Performed by: Hinda Kehr, MD Authorized by: Hinda Kehr, MD   Critical care provider statement:    Critical care time (minutes):  40   Critical care time was exclusive of:  Separately billable procedures and treating other patients   Critical care was necessary to treat or prevent imminent or life-threatening deterioration of the following conditions:  Respiratory failure and sepsis   Critical care was time spent personally by me on the following activities:  Development of treatment plan with patient or surrogate, evaluation of patient's response to treatment, examination of patient, obtaining history from patient or surrogate, ordering and performing treatments and interventions, ordering and review of laboratory studies, ordering and review of radiographic studies, pulse oximetry, re-evaluation of patient's condition and review of old charts   MEDICATIONS ORDERED IN ED: Medications  cefTRIAXone (ROCEPHIN) 2 g in sodium chloride 0.9 % 100 mL IVPB (2 g Intravenous New Bag/Given 07/31/21 0737)  azithromycin (ZITHROMAX) 500 mg in sodium chloride 0.9 % 250 mL IVPB (500 mg Intravenous New Bag/Given 07/31/21 0800)  ipratropium-albuterol (DUONEB) 0.5-2.5 (3) MG/3ML nebulizer solution 3 mL (has no administration in time range)  albuterol (PROVENTIL) (2.5 MG/3ML) 0.083% nebulizer solution 2.5 mg (has no administration in time range)  lactated ringers bolus 1,000 mL (1,000 mLs Intravenous New Bag/Given 07/31/21 0738)     IMPRESSION / MDM / [place] / ED COURSE  I reviewed  the triage vital signs and the nursing notes.                              Differential diagnosis includes, but is not limited to, COPD exacerbation, pneumonia, sepsis, PE, ACS.  The patient denied twice during my interview that he is having any chest pain.  He just feels very short of breath.  He certainly has high risk for ACS but it is unlikely given that he is having no pain.  I reviewed his EKG and there is some substantial ST segment depression but he is now feeling better overall than he was  previously now that he is on oxygen and his respiratory status is improving.  I initially considered BiPAP but it no longer seems necessary.  We will continue to monitor and treat and he will need admission for COPD exacerbation if nothing else.  The patient is on the cardiac monitor to evaluate for evidence of arrhythmia and/or significant heart rate changes.  Patient is somewhat frustrated and irritated at his current condition and his gradual worsening over the last year, but is nontoxic in appearance.  Respiratory viral panel is negative.  CBC is notable for mild leukocytosis of 14.9.  Basic metabolic panel is within normal limits.    Clinical Course as of 07/31/21 [id number]  Sat Jul 31, 2021  0713 I personally reviewed the patient's chest x-ray and there is diffuse opacity throughout.  The radiologist is concerned that this may represent atypical infection.  The patient's heart rate has improved from the 120s but is still in the 10 8-1 15 range.  He has tachycardia, tachypnea, and when I reviewed his lab work he demonstrates a leukocytosis of 14.9.  This meets criteria for sepsis, particularly in the setting of a probable respiratory infection/pneumonia.  I activated code sepsis and ordered blood cultures and lactic acid.  I ordered antibiotics including ceftriaxone 2 g IV and azithromycin 500 mg IV.  I also ordered 1 L lactated ringer IV bolus for the insensible losses and his persistent tachycardia  and for meeting sepsis criteria in the setting of the pending lactic acid.  I am consulting the hospitalist for admission.  Patient is still pursing his lips when he breathes but does not need BiPAP at this time.  I ordered another DuoNeb since he received 2 en route by EMS, and 3 is typically the therapeutic maximum for DuoNebs. [CF]  [id number] I consulted Dr. Francine Graven with the hospitalist service for admission.  We discussed the case and she will admit the patient for further management of his COPD exacerbation with meeting sepsis criteria and probable atypical pneumonia. [CF]  [phone] Of note, when I was reviewing the patient's data and doing MyChart, I discovered that a high-sensitivity troponin was not initially ordered.  I added this on given his EKG changes that suggest ischemia even in the setting of no chest pain.  I sent a secure chat message to Dr. Francine Graven, the admitting physician, to let her know that I did not originally order the troponin but have added on so that she can follow-up on it.  She acknowledged my message and will follow up on the troponin value. [CF]    Clinical Course User Index [CF] Hinda Kehr, MD     FINAL CLINICAL IMPRESSION(S) / ED DIAGNOSES   Final diagnoses:  COPD exacerbation ([place])  Sepsis, due to unspecified organism, unspecified whether acute organ dysfunction present [facility])  Atypical pneumonia     Rx / DC Orders   ED Discharge Orders     None        Note:  This document was prepared using Dragon voice recognition software and may include unintentional dictation errors.   Hinda Kehr, MD 07/31/21 [id number]

## 2021-07-31 NOTE — Consult Note (Signed)
[facility] Cardiology  CARDIOLOGY CONSULT NOTE  Patient ID: SAVANNAH PANICK MRN: [id number] DOB/AGE: September 24, 1950 71 y.o.  Admit date: 07/31/2021 Referring Physician Agbata Primary Physician Dion Body, MD Primary Cardiologist None Reason for Consultation Elevated troponin  HPI:  Dimitri Forshey is a 71 year old male with a history of COPD, ongoing tobacco abuse, hypertension, type 2 diabetes who was admitted with shortness of breath thought to be related to a COPD exacerbation.  Cardiology is consulted for an elevated troponin.  Patient endorses 1 week of worsening shortness of breath which eventually got to the point where he needed to present to the hospital this morning.  States that he has had congestion and cough productive of significant sputum.  His primary care provider had prescribed him azithromycin earlier in the week without significant improvement in his symptoms.  He has not had any chest pain, lower extremity edema, orthopnea, PND.  On arrival to the emergency department his vital signs are notable for a heart rate of 123, and a blood pressure of 109/62.  Labs are notable for a normal creatinine, WBC 14.9, lactic acid increasing from 1.4-2.4.  High-sensitivity troponin was [phone] prompting consultation.  Review of systems complete and found to be negative unless listed above     Past Medical History:  Diagnosis Date   COPD (chronic obstructive pulmonary disease) ([place])    Diabetes mellitus without complication ([place])    Hypertension     History reviewed. No pertinent surgical history.  (Not in a hospital admission)  Social History   Socioeconomic History   Marital status: Divorced    Spouse name: Not on file   Number of children: Not on file   Years of education: Not on file   Highest education level: Not on file  Occupational History   Not on file  Tobacco Use   Smoking status: Every Day    Packs/day: 0.50    Types: Cigarettes   Smokeless tobacco: Not  on file  Substance and Sexual Activity   Alcohol use: Not on file   Drug use: Not on file   Sexual activity: Not on file  Other Topics Concern   Not on file  Social History Narrative   Not on file   Social Determinants of Health   Financial Resource Strain: Not on file  Food Insecurity: Not on file  Transportation Needs: Not on file  Physical Activity: Not on file  Stress: Not on file  Social Connections: Not on file  Intimate Partner Violence: Not on file    History reviewed. No pertinent family history.    Review of systems complete and found to be negative unless listed above      PHYSICAL EXAM  General: Well developed, well nourished, in no acute distress HEENT:  Normocephalic and atramatic Neck:  No JVD.  Lungs: Poor air movement.  Heart: HRRR . Normal S1 and S2 without gallops or murmurs.  Abdomen: Bowel sounds are positive, abdomen soft and non-tender  Msk:  Back normal, normal gait. Normal strength and tone for age. Extremities: No clubbing, cyanosis or edema.   Neuro: Alert and oriented X 3. Psych:  Good affect, responds appropriately  Labs:   Lab Results  Component Value Date   WBC 14.9 (H) 07/31/2021   HGB 13.5 07/31/2021   HCT 41.6 07/31/2021   MCV 92.9 07/31/2021   PLT 268 07/31/2021    Recent Labs  Lab 07/31/21 0600  NA 138  K 3.8  CL 105  CO2 24  BUN  15  CREATININE 0.73  CALCIUM 9.1  GLUCOSE 194*   No results found for: CKTOTAL, CKMB, CKMBINDEX, TROPONINI No results found for: CHOL No results found for: HDL No results found for: LDLCALC No results found for: TRIG No results found for: CHOLHDL No results found for: LDLDIRECT    Radiology: DG Chest 1 View  Result Date: 07/31/2021 CLINICAL DATA:  71 year old male with increasing shortness of breath. EXAM: CHEST  1 VIEW COMPARISON:  None. FINDINGS: Portable AP upright view at 0632 hours. Normal cardiac size and mediastinal contours. Large lung volumes. Visualized tracheal air column is  within normal limits. No pneumothorax, pleural effusion or confluent pulmonary opacity. But diffuse increased pulmonary interstitial opacity, somewhat coarse and basilar predominant. No confluent opacity. Partially visible cervical ACDF. No acute osseous abnormality identified. Negative visible bowel gas. IMPRESSION: 1. Evidence of pulmonary hyperinflation with diffuse increased interstitial opacity. Top differential considerations are acute interstitial edema, viral/atypical respiratory infection, chronic interstitial lung disease. 2. No cardiomegaly.  No pleural effusion is evident. Electronically Signed   By: Genevie Ann M.D.   On: 07/31/2021 06:57   CT CHEST WO CONTRAST  Result Date: 07/31/2021 CLINICAL DATA:  Shortness of breath. EXAM: CT CHEST WITHOUT CONTRAST TECHNIQUE: Multidetector CT imaging of the chest was performed following the standard protocol without IV contrast. RADIATION DOSE REDUCTION: This exam was performed according to the departmental dose-optimization program which includes automated exposure control, adjustment of the mA and/or kV according to patient size and/or use of iterative reconstruction technique. COMPARISON:  None. FINDINGS: Cardiovascular: Normal heart size. No pericardial effusion. Aortic atherosclerosis and 3 vessel coronary artery calcifications. No pericardial effusion Mediastinum/Nodes: No enlarged mediastinal or axillary lymph nodes. Thyroid gland, trachea, and esophagus demonstrate no significant findings. Lungs/Pleura: Moderate paraseptal and centrilobular emphysema identified. Diffuse bronchial wall thickening is identified bilaterally. Mild cylindrical bronchiectasis noted. There is no pleural effusion, airspace consolidation or pneumothorax. No significant subpleural reticulation, honeycombing or ground-glass attenuation. -Scattered peripheral predominant tree-in-bud nodules are identified within both lungs compatible with inflammatory or infectious bronchiolitis, for  example image 69/3. -There also scattered areas of mucoid impaction within dilated peripheral bronchials, for example, image 144/3. -Perifissural nodule along the minor fissure measures 6 mm, image 99/3. Perifissural nodule along the posterior major fissure measures 5 mm, image 74/3. These likely represent a benign intrapulmonary lymph nodes. -Subpleural nodule within the lateral left base measures 6 mm, image 139/3. -Peripheral nodule in the superior segment of the right lower lobe measures 5 mm, image 88/3. Upper Abdomen: No acute abnormality within the imaged portions of the upper abdomen. Musculoskeletal: No chest wall mass or suspicious bone lesions identified. IMPRESSION: 1. Diffuse bronchial wall thickening with emphysema, as above; imaging findings suggestive of underlying COPD. Mild diffuse cylindrical bronchiectasis is identified likely the sequelae of chronic inflammation/infection. 2. Scattered peripheral predominant tree-in-bud nodules are identified within both lungs compatible with inflammatory or infectious bronchiolitis. 3. Left lung base and right lower lobe lung nodules measure up to 6 mm. Non-contrast chest CT at 3-6 months is recommended. If the nodules are stable at time of repeat CT, then future CT at 18-24 months (from today's scan) is considered optional for low-risk patients, but is recommended for high-risk patients. This recommendation follows the consensus statement: Guidelines for Management of Incidental Pulmonary Nodules Detected on CT Images:From the Fleischner Society 2017; published online before print (10.1148/radiol.SG:5268862). 4. Aortic Atherosclerosis (ICD10-I70.0) and Emphysema (ICD10-J43.9). Electronically Signed   By: Kerby Moors M.D.   On: 07/31/2021 08:41  CT Angio Chest Pulmonary Embolism (PE) W or WO Contrast  Result Date: 07/31/2021 CLINICAL DATA:  PE suspected EXAM: CT ANGIOGRAPHY CHEST WITH CONTRAST TECHNIQUE: Multidetector CT imaging of the chest was  performed using the standard protocol during bolus administration of intravenous contrast. Multiplanar CT image reconstructions and MIPs were obtained to evaluate the vascular anatomy. RADIATION DOSE REDUCTION: This exam was performed according to the departmental dose-optimization program which includes automated exposure control, adjustment of the mA and/or kV according to patient size and/or use of iterative reconstruction technique. CONTRAST:  31mL OMNIPAQUE IOHEXOL 350 MG/ML SOLN COMPARISON:  Noncontrast CT chest, 07/31/2021 FINDINGS: Cardiovascular: Satisfactory opacification of the pulmonary arteries to the segmental level. No evidence of pulmonary embolism. Normal heart size. Three-vessel coronary artery calcification. No pericardial effusion. Aortic atherosclerosis. Mediastinum/Nodes: No enlarged mediastinal, hilar, or axillary lymph nodes. Thyroid gland, trachea, and esophagus demonstrate no significant findings. Lungs/Pleura: Moderate centrilobular emphysema. Diffuse bilateral bronchial wall thickening and scattered bronchiolar plugging. Extensive, diffusely scattered centrilobular and tree-in-bud nodularity throughout the lungs. Occasional small, discrete pulmonary nodules, including a 0.5 cm nodule of the anterior medial segment right middle lobe (series 6, image 67) and a 0.5 cm nodule of the dependent superior segment right lower lobe (series 6, image 52). No pleural effusion or pneumothorax. Upper Abdomen: No acute abnormality. Musculoskeletal: No chest wall abnormality. No acute osseous findings. Review of the MIP images confirms the above findings. IMPRESSION: 1. Negative examination for pulmonary embolism. 2. Extensive, diffusely scattered centrilobular and tree-in-bud nodularity throughout the lungs, consistent with atypical infection, particularly atypical Mycobacterium. 3. Diffuse bilateral bronchial wall thickening and occasional bronchiolar mucous plugging, infectious or inflammatory. 4.  Occasional small, discrete pulmonary nodules, measuring 0.5 cm or smaller, as seen on prior same day CT. 5. Emphysema. 6. Coronary artery disease. Aortic Atherosclerosis (ICD10-I70.0) and Emphysema (ICD10-J43.9). Electronically Signed   By: Delanna Ahmadi M.D.   On: 07/31/2021 13:10    EKG: Sinus tachycardia, ST depressions in the inferior and lateral leads  ASSESSMENT AND PLAN:  Dimitri Kawecki is a 71 year old male with a history of COPD, ongoing tobacco abuse, hypertension, type 2 diabetes who was admitted with shortness of breath thought to be related to a COPD exacerbation.  Cardiology is consulted for an elevated troponin.  #Elevated troponin due to demand ischemia #COPD with acute exacerbation #Acute hypoxic respiratory failure Patient is admitted with a COPD exacerbation requiring nasal cannula oxygen supplementation.  In this setting his troponin increased to 123 and subsequently decreased.  He has no chest pain.  This represents demand ischemia in the setting of underlying coronary disease (CTA shows three-vessel CAD); notably, his EKG shows diffuse ST depressions in the setting of this illness which likely represents three-vessel coronary artery disease. -Start aspirin 81 mg -Treat his COPD exacerbation as you are doing -Stop simvastatin, start atorvastatin 40 mg -Continue home lisinopril -Echocardiogram complete   Signed: Andrez Grime MD 07/31/2021, 3:11 PM

## 2021-07-31 NOTE — ED Notes (Signed)
Patient transported to CT 

## 2021-07-31 NOTE — ED Notes (Signed)
Pt spilled their water on their bed. Bed sheets were changed and pt was positioned comfortably in bed with call bell and the rest of their lunch tray within reach.

## 2021-07-31 NOTE — Plan of Care (Signed)
Resp rounded - left him on 3LNC.  Educated. We got moisture added to O2.   Resp did note patient seemed to be "Working" a little harder than expected.  Patient Also criticals on Lactic 2.4 and Trop 123.  Dr. Informed.

## 2021-07-31 NOTE — ED Notes (Signed)
Pt assisted to ambulate to restroom. Pt was successful with 1:1 assist. Pt voided and returned to bed with assistance.

## 2021-07-31 NOTE — ED Notes (Signed)
Pt's lunch tray was delivered.  °

## 2021-07-31 NOTE — ED Triage Notes (Signed)
Pt called EMS for increasing SOB apx 1 hour ago. Pt states it woke him up from sleep. Pt 90% on RA upon EMS arrival. HX of COPD.

## 2021-07-31 NOTE — Plan of Care (Signed)
Patient continues to complain of diff breathing.  SPO2 is 94% or greater on 4LNC (acute).  Staes his chest hurts and its hard to take deep breath.  Education attempted but patient is still very anxious and insistent that something is wrong.  Vitals stable otherwise.  Resp contacted to round when able to assess. Dr. Also informed.

## 2021-07-31 NOTE — ED Notes (Signed)
Pt. Received breakfast tray  °

## 2021-07-31 NOTE — ED Notes (Signed)
Provider at bedside

## 2021-07-31 NOTE — Progress Notes (Signed)
CODE SEPSIS - PHARMACY COMMUNICATION  **Broad Spectrum Antibiotics should be administered within 1 hour of Sepsis diagnosis**  Time Code Sepsis Called/Page Received: 0709  Antibiotics Ordered: ceftriaxone + azithromycin  Time of 1st antibiotic administration: 0737  Additional action taken by pharmacy: none required  If necessary, Name of Provider/Nurse Contacted: N/A    Lowella Bandy ,PharmD Clinical Pharmacist  07/31/2021  8:59 AM

## 2021-08-01 ENCOUNTER — Encounter: Payer: Self-pay | Admitting: Obstetrics and Gynecology

## 2021-08-01 ENCOUNTER — Inpatient Hospital Stay: Admit: 2021-08-01 | Payer: Medicare Other

## 2021-08-01 DIAGNOSIS — J449 Chronic obstructive pulmonary disease, unspecified: Secondary | ICD-10-CM | POA: Insufficient documentation

## 2021-08-01 DIAGNOSIS — G3184 Mild cognitive impairment, so stated: Secondary | ICD-10-CM | POA: Insufficient documentation

## 2021-08-01 DIAGNOSIS — E1159 Type 2 diabetes mellitus with other circulatory complications: Secondary | ICD-10-CM | POA: Insufficient documentation

## 2021-08-01 DIAGNOSIS — I10 Essential (primary) hypertension: Secondary | ICD-10-CM

## 2021-08-01 DIAGNOSIS — I152 Hypertension secondary to endocrine disorders: Secondary | ICD-10-CM | POA: Insufficient documentation

## 2021-08-01 LAB — HEPATIC FUNCTION PANEL
ALT: 29 U/L (ref 0–44)
AST: 25 U/L (ref 15–41)
Albumin: 3.2 g/dL — ABNORMAL LOW (ref 3.5–5.0)
Alkaline Phosphatase: 69 U/L (ref 38–126)
Bilirubin, Direct: <0.1 mg/dL (ref 0.0–0.2)
Total Bilirubin: 0.4 mg/dL (ref 0.3–1.2)
Total Protein: 6.8 g/dL (ref 6.5–8.1)

## 2021-08-01 LAB — BASIC METABOLIC PANEL
Anion gap: 6 (ref 5–15)
BUN: 18 mg/dL (ref 8–23)
CO2: 27 mmol/L (ref 22–32)
Calcium: 9.1 mg/dL (ref 8.9–10.3)
Chloride: 106 mmol/L (ref 98–111)
Creatinine, Ser: 0.73 mg/dL (ref 0.61–1.24)
GFR, Estimated: >60 mL/min (ref >60)
Glucose, Bld: 171 mg/dL — ABNORMAL HIGH (ref 70–99)
Potassium: 4 mmol/L (ref 3.5–5.1)
Sodium: 139 mmol/L (ref 135–145)

## 2021-08-01 LAB — CBC
HCT: 36.1 % — ABNORMAL LOW (ref 39.0–52.0)
Hemoglobin: 11.9 g/dL — ABNORMAL LOW (ref 13.0–17.0)
MCH: 30.1 pg (ref 26.0–34.0)
MCHC: 33 g/dL (ref 30.0–36.0)
MCV: 91.2 fL (ref 80.0–100.0)
Platelets: 267 10*3/uL (ref 150–400)
RBC: 3.96 MIL/uL — ABNORMAL LOW (ref 4.22–5.81)
RDW: 14.2 % (ref 11.5–15.5)
WBC: 16.8 10*3/uL — ABNORMAL HIGH (ref 4.0–10.5)
nRBC: 0 % (ref 0.0–0.2)

## 2021-08-01 LAB — EXPECTORATED SPUTUM ASSESSMENT W GRAM STAIN, RFLX TO RESP C

## 2021-08-01 LAB — GLUCOSE, CAPILLARY
Glucose-Capillary: 165 mg/dL — ABNORMAL HIGH (ref 70–99)
Glucose-Capillary: 306 mg/dL — ABNORMAL HIGH (ref 70–99)
Glucose-Capillary: 110 mg/dL — ABNORMAL HIGH (ref 70–99)
Glucose-Capillary: 202 mg/dL — ABNORMAL HIGH (ref 70–99)

## 2021-08-01 LAB — LIPID PANEL
Cholesterol: 135 mg/dL (ref 0–200)
HDL: 55 mg/dL (ref >40)
LDL Cholesterol: 67 mg/dL (ref 0–99)
Total CHOL/HDL Ratio: 2.5 RATIO
Triglycerides: 64 mg/dL (ref <150)
VLDL: 13 mg/dL (ref 0–40)

## 2021-08-01 MED ORDER — IPRATROPIUM-ALBUTEROL 0.5-2.5 (3) MG/3ML IN SOLN
3.0000 mL | Freq: Three times a day (TID) | RESPIRATORY_TRACT | Status: DC
  Administered 2021-08-01 – 2021-08-02 (×2): 3 mL via RESPIRATORY_TRACT
  Filled 2021-08-01 (×2): qty 3

## 2021-08-01 MED ORDER — INSULIN GLARGINE-YFGN 100 UNIT/ML ~~LOC~~ SOLN
10.0000 [IU] | Freq: Every day | SUBCUTANEOUS | Status: DC
  Administered 2021-08-01 – 2021-08-03 (×3): 10 [IU] via SUBCUTANEOUS
  Filled 2021-08-01 (×3): qty 0.1

## 2021-08-01 MED ORDER — CIPROFLOXACIN HCL 500 MG PO TABS
500.0000 mg | ORAL_TABLET | Freq: Two times a day (BID) | ORAL | Status: DC
  Administered 2021-08-01 – 2021-08-04 (×7): 500 mg via ORAL
  Filled 2021-08-01 (×8): qty 1

## 2021-08-01 MED ORDER — IPRATROPIUM-ALBUTEROL 0.5-2.5 (3) MG/3ML IN SOLN
3.0000 mL | Freq: Four times a day (QID) | RESPIRATORY_TRACT | Status: DC
  Filled 2021-08-01: qty 3

## 2021-08-01 MED ORDER — ALUM & MAG HYDROXIDE-SIMETH 200-200-20 MG/5ML PO SUSP
15.0000 mL | ORAL | Status: DC | PRN
  Administered 2021-08-01: 15 mL via ORAL
  Filled 2021-08-01: qty 30

## 2021-08-01 NOTE — Progress Notes (Addendum)
PROGRESS NOTE    Tyler [name]  ZOX:[id number] DOB: 02-10-1951 DOA: 07/31/2021 PCP: Dion Body, MD  Outpatient Specialists: pulmonology    Brief Narrative:   Hx smoking, copd, htn, dm, presenting with one day cough and shortness of breath, admitted for COPD exacerbation with acute hypoxic respiratory failure   Assessment & Plan:   Principal Problem:   COPD with acute exacerbation ([place]) Active Problems:   Diabetes mellitus ([place])   Nicotine dependence   Elevated troponin   HTN (hypertension)   # COPD with exacerbation Hx COPD, chronic smoker. Trelegy ordered by pulm at last visit but patient says he can't afford, is only taking duonebs as needed. CTA neg for PE but findings read as suggestive of mycobacteria. Recently treated with azithromycin - stop azith/ceftriaxone, start cipro 500 bid (abx started 2/4) - continue steroids (started 2/4) - sputum for culture - touched base w/ Dr. 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 He advises treating for exacerbation as we currently are, with outpt pulm f/u for MAC w/u.  # Acute hypoxic respiratory failure 2/2 copd exacerbation - cont [place] o2, wean as able  # Demand ischemia Cardiology has seen, thinks demand from acute illness, likely CAD - statin switched to atorva, aspirin started - will need outpt cardiology f/u for ischemic w/u - monitor tele - TTE ordered, but patient refused  # HTN Here bp wnl - cont home lisinopril  # T2DM Here glucose mildly elevated in setting of steroids - SSI - start semglee 10    DVT prophylaxis: lovenox Code Status: DNR Family Communication: none @ bedside  Level of care: Progressive Status is: Inpatient Remains inpatient appropriate because: severity of illness    Consultants:  cardiology  Procedures: none  Antimicrobials:  Azith/ceftriaxone>cipro    Subjective: This morning says breathing feeling somewhat improved. No chest pain  Objective: Vitals:    07/31/21 2030 07/31/21 2344 08/01/21 0455 08/01/21 0725  BP:  (!) 142/89 137/74 122/71  Pulse: 87 79 75 77  Resp:  _0 Temp:  98 F (36.7 C) 98.2 F (36.8 C) 98 F (36.7 C)  TempSrc:  Oral    SpO2: 96% 92% 94% 97%  Weight:      Height:       No intake or output data in the 24 hours ending 08/01/21 0823 Filed Weights   07/31/21 0556  Weight: 74.8 kg    Examination:  General exam: Appears calm and comfortable  Respiratory system: rhonchi and exp wheeze throughout Cardiovascular system: S1 & S2 heard, RRR. No JVD, murmurs, rubs, gallops or clicks. No pedal edema. Gastrointestinal system: Abdomen is nondistended, soft and nontender. No organomegaly or masses felt. Normal bowel sounds heard. Central nervous system: Alert and oriented. No focal neurological deficits. Extremities: Symmetric 5 x 5 power. Skin: No rashes, lesions or ulcers Psychiatry: Judgement and insight appear normal. Mood & affect appropriate.     Data Reviewed: I have personally reviewed following labs and imaging studies  CBC: Recent Labs  Lab 07/31/21 0600 08/01/21 0519  WBC 14.9* 16.8*  HGB 13.5 11.9*  HCT 41.6 36.1*  MCV 92.9 91.2  PLT 268 811   Basic Metabolic Panel: Recent Labs  Lab 07/31/21 0600 08/01/21 0519  NA 138 139  K 3.8 4.0  CL 105 106  CO2 24 27  GLUCOSE 194* 171*  BUN 15 18  CREATININE 0.73 0.73  CALCIUM 9.1 9.1   GFR: Estimated Creatinine Clearance: 83.5 mL/min (by C-G formula based on  SCr of 0.73 mg/dL). Liver Function Tests: No results for input(s): AST, ALT, ALKPHOS, BILITOT, PROT, ALBUMIN in the last 168 hours. No results for input(s): LIPASE, AMYLASE in the last 168 hours. No results for input(s): AMMONIA in the last 168 hours. Coagulation Profile: No results for input(s): INR, PROTIME in the last 168 hours. Cardiac Enzymes: No results for input(s): CKTOTAL, CKMB, CKMBINDEX, TROPONINI in the last 168 hours. BNP (last 3 results) No results for input(s):  PROBNP in the last 8760 hours. HbA1C: Recent Labs    07/31/21 1104  HGBA1C 5.8*   CBG: Recent Labs  Lab 07/31/21 1136 07/31/21 1650 07/31/21 2000 08/01/21 0726  GLUCAP 309* 127* 228* 165*   Lipid Profile: Recent Labs    08/01/21 0519  CHOL 135  HDL 55  LDLCALC 67  TRIG 64  CHOLHDL 2.5   Thyroid Function Tests: No results for input(s): TSH, T4TOTAL, FREET4, T3FREE, THYROIDAB in the last 72 hours. Anemia Panel: No results for input(s): VITAMINB12, FOLATE, FERRITIN, TIBC, IRON, RETICCTPCT in the last 72 hours. Urine analysis: No results found for: COLORURINE, APPEARANCEUR, LABSPEC, [place], GLUCOSEU, HGBUR, BILIRUBINUR, KETONESUR, PROTEINUR, UROBILINOGEN, NITRITE, LEUKOCYTESUR Sepsis Labs: _0 (procalcitonin:4,lacticidven:4)  ) Recent Results (from the past 240 hour(s))  Resp Panel by RT-PCR (Flu A&B, Covid) Nasopharyngeal Swab     Status: None   Collection Time: 07/31/21  6:09 AM   Specimen: Nasopharyngeal Swab; Nasopharyngeal(NP) swabs in vial transport medium  Result Value Ref Range Status   SARS Coronavirus 2 by RT PCR NEGATIVE NEGATIVE Final    Comment: (NOTE) SARS-CoV-2 target nucleic acids are NOT DETECTED.  The SARS-CoV-2 RNA is generally detectable in upper respiratory specimens during the acute phase of infection. The lowest concentration of SARS-CoV-2 viral copies this assay can detect is 138 copies/mL. A negative result does not preclude SARS-Cov-2 infection and should not be used as the sole basis for treatment or other patient management decisions. A negative result may occur with  improper specimen collection/handling, submission of specimen other than nasopharyngeal swab, presence of viral mutation(s) within the areas targeted by this assay, and inadequate number of viral copies(<138 copies/mL). A negative result must be combined with clinical observations, patient history, and epidemiological information. The expected result is  Negative.  Fact Sheet for Patients:  EntrepreneurPulse.com.au  Fact Sheet for Healthcare Providers:  IncredibleEmployment.be  This test is no t yet approved or cleared by the [place] FDA and  has been authorized for detection and/or diagnosis of SARS-CoV-2 by FDA under an Emergency Use Authorization (EUA). This EUA will remain  in effect (meaning this test can be used) for the duration of the COVID-19 declaration under Section 564(b)(1) of the Act, 21 U.S.C.section 360bbb-3(b)(1), unless the authorization is terminated  or revoked sooner.       Influenza A by PCR NEGATIVE NEGATIVE Final   Influenza B by PCR NEGATIVE NEGATIVE Final    Comment: (NOTE) The Xpert Xpress SARS-CoV-2/FLU/RSV plus assay is intended as an aid in the diagnosis of influenza from Nasopharyngeal swab specimens and should not be used as a sole basis for treatment. Nasal washings and aspirates are unacceptable for Xpert Xpress SARS-CoV-2/FLU/RSV testing.  Fact Sheet for Patients: EntrepreneurPulse.com.au  Fact Sheet for Healthcare Providers: IncredibleEmployment.be  This test is not yet approved or cleared by the [place] FDA and has been authorized for detection and/or diagnosis of SARS-CoV-2 by FDA under an Emergency Use Authorization (EUA). This EUA will remain in effect (meaning this test can be used) for the duration  of the COVID-19 declaration under Section 564(b)(1) of the Act, 21 U.S.C. section 360bbb-3(b)(1), unless the authorization is terminated or revoked.  Performed at [facility], [address] [place], [place] [address]   Blood Culture (routine x 2)     Status: None (Preliminary result)   Collection Time: 07/31/21  7:12 AM   Specimen: BLOOD  Result Value Ref Range Status   Specimen Description BLOOD BLOOD LEFT FOREARM  Final   Special Requests   Final    BOTTLES DRAWN AEROBIC AND ANAEROBIC  Blood Culture adequate volume   Culture   Final    NO GROWTH < 24 HOURS Performed at [facility], [address], [place], [place] [address]    Report Status PENDING  Incomplete  Blood Culture (routine x 2)     Status: None (Preliminary result)   Collection Time: 07/31/21  7:17 AM   Specimen: BLOOD  Result Value Ref Range Status   Specimen Description BLOOD BLOOD LEFT HAND  Final   Special Requests   Final    BOTTLES DRAWN AEROBIC AND ANAEROBIC Blood Culture adequate volume   Culture   Final    NO GROWTH < 24 HOURS Performed at [facility], [address] [place], [place] [address]    Report Status PENDING  Incomplete         Radiology Studies: DG Chest 1 View  Result Date: 07/31/2021 CLINICAL DATA:  71 year old male with increasing shortness of breath. EXAM: CHEST  1 VIEW COMPARISON:  None. FINDINGS: Portable AP upright view at 0632 hours. Normal cardiac size and mediastinal contours. Large lung volumes. Visualized tracheal air column is within normal limits. No pneumothorax, pleural effusion or confluent pulmonary opacity. But diffuse increased pulmonary interstitial opacity, somewhat coarse and basilar predominant. No confluent opacity. Partially visible cervical ACDF. No acute osseous abnormality identified. Negative visible bowel gas. IMPRESSION: 1. Evidence of pulmonary hyperinflation with diffuse increased interstitial opacity. Top differential considerations are acute interstitial edema, viral/atypical respiratory infection, chronic interstitial lung disease. 2. No cardiomegaly.  No pleural effusion is evident. Electronically Signed   By: Genevie Ann M.D.   On: 07/31/2021 06:57   CT CHEST WO CONTRAST  Result Date: 07/31/2021 CLINICAL DATA:  Shortness of breath. EXAM: CT CHEST WITHOUT CONTRAST TECHNIQUE: Multidetector CT imaging of the chest was performed following the standard protocol without IV contrast. RADIATION DOSE REDUCTION: This exam was performed  according to the departmental dose-optimization program which includes automated exposure control, adjustment of the mA and/or kV according to patient size and/or use of iterative reconstruction technique. COMPARISON:  None. FINDINGS: Cardiovascular: Normal heart size. No pericardial effusion. Aortic atherosclerosis and 3 vessel coronary artery calcifications. No pericardial effusion Mediastinum/Nodes: No enlarged mediastinal or axillary lymph nodes. Thyroid gland, trachea, and esophagus demonstrate no significant findings. Lungs/Pleura: Moderate paraseptal and centrilobular emphysema identified. Diffuse bronchial wall thickening is identified bilaterally. Mild cylindrical bronchiectasis noted. There is no pleural effusion, airspace consolidation or pneumothorax. No significant subpleural reticulation, honeycombing or ground-glass attenuation. -Scattered peripheral predominant tree-in-bud nodules are identified within both lungs compatible with inflammatory or infectious bronchiolitis, for example image 69/3. -There also scattered areas of mucoid impaction within dilated peripheral bronchials, for example, image 144/3. -Perifissural nodule along the minor fissure measures 6 mm, image 99/3. Perifissural nodule along the posterior major fissure measures 5 mm, image 74/3. These likely represent a benign intrapulmonary lymph nodes. -Subpleural nodule within the lateral left base measures 6 mm, image 139/3. -Peripheral nodule in the superior segment  of the right lower lobe measures 5 mm, image 88/3. Upper Abdomen: No acute abnormality within the imaged portions of the upper abdomen. Musculoskeletal: No chest wall mass or suspicious bone lesions identified. IMPRESSION: 1. Diffuse bronchial wall thickening with emphysema, as above; imaging findings suggestive of underlying COPD. Mild diffuse cylindrical bronchiectasis is identified likely the sequelae of chronic inflammation/infection. 2. Scattered peripheral predominant  tree-in-bud nodules are identified within both lungs compatible with inflammatory or infectious bronchiolitis. 3. Left lung base and right lower lobe lung nodules measure up to 6 mm. Non-contrast chest CT at 3-6 months is recommended. If the nodules are stable at time of repeat CT, then future CT at 18-24 months (from today's scan) is considered optional for low-risk patients, but is recommended for high-risk patients. This recommendation follows the consensus statement: Guidelines for Management of Incidental Pulmonary Nodules Detected on CT Images:From the Fleischner Society 2017; published online before print (10.1148/radiol.[id number]). 4. Aortic Atherosclerosis (ICD10-I70.0) and Emphysema (ICD10-J43.9). Electronically Signed   By: Kerby Moors M.D.   On: 07/31/2021 08:41   CT Angio Chest Pulmonary Embolism (PE) W or WO Contrast  Result Date: 07/31/2021 CLINICAL DATA:  PE suspected EXAM: CT ANGIOGRAPHY CHEST WITH CONTRAST TECHNIQUE: Multidetector CT imaging of the chest was performed using the standard protocol during bolus administration of intravenous contrast. Multiplanar CT image reconstructions and MIPs were obtained to evaluate the vascular anatomy. RADIATION DOSE REDUCTION: This exam was performed according to the departmental dose-optimization program which includes automated exposure control, adjustment of the mA and/or kV according to patient size and/or use of iterative reconstruction technique. CONTRAST:  72m OMNIPAQUE IOHEXOL 350 MG/ML SOLN COMPARISON:  Noncontrast CT chest, 07/31/2021 FINDINGS: Cardiovascular: Satisfactory opacification of the pulmonary arteries to the segmental level. No evidence of pulmonary embolism. Normal heart size. Three-vessel coronary artery calcification. No pericardial effusion. Aortic atherosclerosis. Mediastinum/Nodes: No enlarged mediastinal, hilar, or axillary lymph nodes. Thyroid gland, trachea, and esophagus demonstrate no significant findings. Lungs/Pleura:  Moderate centrilobular emphysema. Diffuse bilateral bronchial wall thickening and scattered bronchiolar plugging. Extensive, diffusely scattered centrilobular and tree-in-bud nodularity throughout the lungs. Occasional small, discrete pulmonary nodules, including a 0.5 cm nodule of the anterior medial segment right middle lobe (series 6, image 67) and a 0.5 cm nodule of the dependent superior segment right lower lobe (series 6, image 52). No pleural effusion or pneumothorax. Upper Abdomen: No acute abnormality. Musculoskeletal: No chest wall abnormality. No acute osseous findings. Review of the MIP images confirms the above findings. IMPRESSION: 1. Negative examination for pulmonary embolism. 2. Extensive, diffusely scattered centrilobular and tree-in-bud nodularity throughout the lungs, consistent with atypical infection, particularly atypical Mycobacterium. 3. Diffuse bilateral bronchial wall thickening and occasional bronchiolar mucous plugging, infectious or inflammatory. 4. Occasional small, discrete pulmonary nodules, measuring 0.5 cm or smaller, as seen on prior same day CT. 5. Emphysema. 6. Coronary artery disease. Aortic Atherosclerosis (ICD10-I70.0) and Emphysema (ICD10-J43.9). Electronically Signed   By: ADelanna AhmadiM.D.   On: 07/31/2021 13:10        Scheduled Meds:  aspirin  81 mg Oral Daily   atorvastatin  40 mg Oral QHS   enoxaparin (LOVENOX) injection  40 mg Subcutaneous Q24H   insulin aspart  0-15 Units Subcutaneous TID AC & HS   ipratropium-albuterol  3 mL Nebulization QID   lisinopril  10 mg Oral Daily   mometasone-formoterol  2 puff Inhalation BID   predniSONE  40 mg Oral Q breakfast   Continuous Infusions:  azithromycin 500 mg (08/01/21 0546)   cefTRIAXone (  ROCEPHIN)  IV 1 g (08/01/21 0503)     LOS: 1 day    Time spent: 67 min    Desma Maxim, MD [facility]   If 7PM-7AM, please contact night-coverage [email or web address] Password [facility] 08/01/2021, 8:23 AM

## 2021-08-01 NOTE — Progress Notes (Signed)
°  Transition of Care [facility]) Screening Note   Patient Details  Name: Synetta Fail EPPIRJJO Date of Birth: December 16, 1950   Transition of [facility]) CM/SW Contact:    Gildardo Griffes, LCSW Phone Number: 08/01/2021, 9:23 AM    Transition of Care Department [facility]) has reviewed patient and no TOC needs have been identified at this time. We will continue to monitor patient advancement through interdisciplinary progression rounds. If new patient transition needs arise, please place a TOC consult.  [place], [place] [phone]

## 2021-08-02 ENCOUNTER — Inpatient Hospital Stay
Admit: 2021-08-02 | Discharge: 2021-08-02 | Disposition: A | Discharge status: Home / Self Care | Payer: Medicare Other | Attending: Cardiology | Admitting: Cardiology

## 2021-08-02 LAB — GLUCOSE, CAPILLARY
Glucose-Capillary: 125 mg/dL — ABNORMAL HIGH (ref 70–99)
Glucose-Capillary: 240 mg/dL — ABNORMAL HIGH (ref 70–99)
Glucose-Capillary: 173 mg/dL — ABNORMAL HIGH (ref 70–99)
Glucose-Capillary: 140 mg/dL — ABNORMAL HIGH (ref 70–99)

## 2021-08-02 LAB — BASIC METABOLIC PANEL
Anion gap: 5 (ref 5–15)
BUN: 29 mg/dL — ABNORMAL HIGH (ref 8–23)
CO2: 30 mmol/L (ref 22–32)
Calcium: 8.9 mg/dL (ref 8.9–10.3)
Chloride: 105 mmol/L (ref 98–111)
Creatinine, Ser: 0.83 mg/dL (ref 0.61–1.24)
GFR, Estimated: >60 mL/min (ref >60)
Glucose, Bld: 117 mg/dL — ABNORMAL HIGH (ref 70–99)
Potassium: 4.6 mmol/L (ref 3.5–5.1)
Sodium: 140 mmol/L (ref 135–145)

## 2021-08-02 LAB — ECHOCARDIOGRAM COMPLETE
Height: 69 in
S' Lateral: 2.2 cm
Weight: 2640 oz

## 2021-08-02 MED ORDER — IPRATROPIUM-ALBUTEROL 0.5-2.5 (3) MG/3ML IN SOLN
3.0000 mL | Freq: Two times a day (BID) | RESPIRATORY_TRACT | Status: DC
  Administered 2021-08-02 – 2021-08-04 (×3): 3 mL via RESPIRATORY_TRACT
  Filled 2021-08-02 (×4): qty 3

## 2021-08-02 NOTE — Evaluation (Signed)
Physical Therapy Evaluation Patient Details Name: Tyler [name] MRN: [id number] DOB: 09/26/1950 Today's Date: 08/02/2021  History of Present Illness  Pt is a 71 y.o. male with medical history significant for COPD, nicotine dependence, hypertension and diabetes mellitus who presents to the emergency room for evaluation of a 1 week history of worsening shortness of breath. MD assessment includes: COPD with exacerbation, acute hypoxic respiratory failure, and demand ischemia.   Clinical Impression  Pt required encouragement to participate initially but then put forth good effort during the session.  Pt required no assist with bed mobility tasks or transfers but was somewhat unsteady upon coming to initial stand with pt able to self-correct.  During progressive ambulation on 3LO2/min pt presented with mod drifting L/R initially but as amb progressed drifting improved, no physical assist needed to correct; SpO2 on 2.5LO2/min at rest 92% and dropped to a low of 89% during amb on 3L, returned to 92% in < 30 sec once seated.  Pt reported feeling weaker than baseline and will benefit from HHPT upon discharge to safely address deficits listed in patient problem list for decreased caregiver assistance and eventual return to PLOF.       Recommendations for follow up therapy are one component of a multi-disciplinary discharge planning process, led by the attending physician.  Recommendations may be updated based on patient status, additional functional criteria and insurance authorization.  Follow Up Recommendations Home health PT    Assistance Recommended at Discharge Intermittent Supervision/Assistance  Patient can return home with the following  A little help with walking and/or transfers;Assist for transportation    Equipment Recommendations Rolling walker (2 wheels)  Recommendations for Other Services       Functional Status Assessment Patient has had a recent decline in their functional status  and demonstrates the ability to make significant improvements in function in a reasonable and predictable amount of time.     Precautions / Restrictions Precautions Precautions: Fall Restrictions Weight Bearing Restrictions: No      Mobility  Bed Mobility Overal bed mobility: Modified Independent             General bed mobility comments: Min extra time and effort only    Transfers Overall transfer level: Needs assistance Equipment used: Straight cane Transfers: Sit to/from Stand Sit to Stand: Supervision           General transfer comment: Min instability upon coming to initial stand with pt able to self-correct    Ambulation/Gait Ambulation/Gait assistance: Min guard Gait Distance (Feet): 30' x1, 60' x 1, 100' x 1 Assistive device: Straight cane Gait Pattern/deviations: Step-through pattern, Decreased step length - right, Decreased step length - left, Drifts right/left Gait velocity: decreased     General Gait Details: Mod drifting L/R initially but as amb progressed drifting improved, no physical assist needed for stability; SpO2 on 2.5LO2/min at rest 92% and dropped to a low of 89% during amb, returned to 92% in < 30 sec once seated.  Stairs            Wheelchair Mobility    Modified Rankin (Stroke Patients Only)       Balance Overall balance assessment: Needs assistance   Sitting balance-Leahy Scale: Normal     Standing balance support: Single extremity supported, During functional activity Standing balance-Leahy Scale: Fair                               Pertinent  Vitals/Pain Pain Assessment Pain Assessment: No/denies pain    Home Living Family/patient expects to be discharged to:: Private residence Living Arrangements: Other relatives Available Help at Discharge: Family;Available PRN/intermittently Type of Home: Apartment Home Access: Level entry       Home Layout: One level Home Equipment: Cane - single point;Grab  bars - tub/shower      Prior Function Prior Level of Function : Independent/Modified Independent             Mobility Comments: Ind amb in the home without an AD, SPC in the community, walks daily for exercise, no fall history ADLs Comments: Ind with ADLs     Hand Dominance        Extremity/Trunk Assessment   Upper Extremity Assessment Upper Extremity Assessment: Generalized weakness    Lower Extremity Assessment Lower Extremity Assessment: Generalized weakness       Communication   Communication: No difficulties  Cognition Arousal/Alertness: Awake/alert Behavior During Therapy: WFL for tasks assessed/performed Overall Cognitive Status: Within Functional Limits for tasks assessed                                          General Comments      Exercises     Assessment/Plan    PT Assessment Patient needs continued PT services  PT Problem List Decreased strength;Decreased activity tolerance;Decreased balance;Decreased mobility;Decreased knowledge of use of DME       PT Treatment Interventions DME instruction;Gait training;Functional mobility training;Therapeutic activities;Therapeutic exercise;Balance training;Patient/family education    PT Goals (Current goals can be found in the Care Plan section)  Acute Rehab PT Goals Patient Stated Goal: To get stronger PT Goal Formulation: With patient Time For Goal Achievement: 08/15/21 Potential to Achieve Goals: Good    Frequency Min 2X/week     Co-evaluation               AM-PAC PT "6 Clicks" Mobility  Outcome Measure Help needed turning from your back to your side while in a flat bed without using bedrails?: None Help needed moving from lying on your back to sitting on the side of a flat bed without using bedrails?: None Help needed moving to and from a bed to a chair (including a wheelchair)?: A Little Help needed standing up from a chair using your arms (e.g., wheelchair or bedside  chair)?: A Little Help needed to walk in hospital room?: A Little Help needed climbing 3-5 steps with a railing? : A Little 6 Click Score: 20    End of Session Equipment Utilized During Treatment: Gait belt;Oxygen Activity Tolerance: Patient tolerated treatment well Patient left: in chair;with call bell/phone within reach;Other (comment) (No green box to plug chair alarm in, nursing notified/aware) Nurse Communication: Mobility status;Other (comment) (Pt needs green box to plug chair alarm into) PT Visit Diagnosis: Unsteadiness on feet (R26.81);Difficulty in walking, not elsewhere classified (R26.2);Muscle weakness (generalized) (M62.81)    Time: 9381-0175 PT Time Calculation (min) (ACUTE ONLY): 31 min   Charges:   PT Evaluation $PT Eval Moderate Complexity: 1 Mod PT Treatments $Gait Training: 8-22 mins       D. Scott Dian Laprade PT, DPT 08/02/21, 11:13 AM

## 2021-08-02 NOTE — Progress Notes (Addendum)
PROGRESS NOTE    Tyler [name]  OIB:[id number] DOB: [birth year] DOA: 07/31/2021 PCP: Tyler Body, MD  Outpatient Specialists: pulmonology    Brief Narrative:   Hx smoking, copd, htn, dm, presenting with one day cough and shortness of breath, admitted for COPD exacerbation with acute hypoxic respiratory failure   Assessment & Plan:   Principal Problem:   COPD with acute exacerbation ([place]) Active Problems:   Diabetes mellitus (Tyler [name])   Nicotine dependence   Elevated troponin   HTN (hypertension)   # COPD with exacerbation Hx COPD, chronic smoker. Trelegy ordered by pulm at last visit but patient says he can't afford, is only taking duonebs as needed. CTA neg for PE but findings read as suggestive of mycobacteria. Recently treated with azithromycin. Symptomatically improving from presentation. - stop azith/ceftriaxone, started cipro 500 bid (abx started 2/4) - continue steroids (started 2/4) - sputum for culture, pending - touched base w/ Dr. 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 He advises treating for exacerbation as we currently are, with outpt pulm f/u for MAC w/u. - PT advises rolling walker, patient refuses  # Acute hypoxic respiratory failure 2/2 copd exacerbation - cont [place] o2, wean as able  # Demand ischemia Cardiology has seen, thinks demand from acute illness, likely CAD. TTE unremarkable. - statin switched to atorva, aspirin started - will need outpt cardiology f/u for ischemic w/u - monitor tele  # HTN Here bp wnl - cont home lisinopril  # T2DM Here glucose mildly elevated in setting of steroids, now normalized with institution of basal insulin - SSI - cont semglee 10    DVT prophylaxis: lovenox Code Status: DNR Family Communication: none @ bedside  Level of care: Progressive Status is: Inpatient Remains inpatient appropriate because: severity of illness    Consultants:  cardiology  Procedures: none  Antimicrobials:   Azith/ceftriaxone>cipro    Subjective: This morning says breathing feeling somewhat improved. No chest pain  Objective: Vitals:   08/02/21 0807 08/02/21 0812 08/02/21 1202 08/02/21 1217  BP:  139/79 (!) 145/91 135/85  Pulse:  91 80   Resp:  (!) 22 18   Temp:  98.1 F (36.7 C) 98.1 F (36.7 C)   TempSrc:  Oral Oral   SpO2: 96% 92% 92%   Weight:      Height:        Intake/Output Summary (Last 24 hours) at 08/02/2021 1404 Last data filed at 08/01/2021 1832 Gross per 24 hour  Intake 660 ml  Output --  Net 660 ml   Filed Weights   07/31/21 0556  Weight: 74.8 kg    Examination:  General exam: Appears calm and comfortable  Respiratory system: wheeze resolved. Decreased breath sounds throughout. Cardiovascular system: S1 & S2 heard, RRR. No JVD, murmurs, rubs, gallops or clicks. No pedal edema. Gastrointestinal system: Abdomen is nondistended, soft and nontender. No organomegaly or masses felt. Normal bowel sounds heard. Central nervous system: Alert and oriented. No focal neurological deficits. Extremities: Symmetric 5 x 5 power. Skin: No rashes, lesions or ulcers Psychiatry: Judgement and insight appear normal. Mood & affect appropriate.     Data Reviewed: I have personally reviewed following labs and imaging studies  CBC: Recent Labs  Lab 07/31/21 0600 08/01/21 0519  WBC 14.9* 16.8*  HGB 13.5 11.9*  HCT 41.6 36.1*  MCV 92.9 91.2  PLT 268 945   Basic Metabolic Panel: Recent Labs  Lab 07/31/21 0600 08/01/21 0519 08/02/21 0350  NA 138 139 140  K 3.8 4.0  4.6  CL 105 106 105  CO2 _0 GLUCOSE 194* 171* 117*  BUN 15 18 29*  CREATININE 0.73 0.73 0.83  CALCIUM 9.1 9.1 8.9   GFR: Estimated Creatinine Clearance: 80.4 mL/min (by C-G formula based on SCr of 0.83 mg/dL). Liver Function Tests: Recent Labs  Lab 08/01/21 0519  AST 25  ALT 29  ALKPHOS 69  BILITOT 0.4  PROT 6.8  ALBUMIN 3.2*   No results for input(s): LIPASE, AMYLASE in the last 168  hours. No results for input(s): AMMONIA in the last 168 hours. Coagulation Profile: No results for input(s): INR, PROTIME in the last 168 hours. Cardiac Enzymes: No results for input(s): CKTOTAL, CKMB, CKMBINDEX, TROPONINI in the last 168 hours. BNP (last 3 results) No results for input(s): PROBNP in the last 8760 hours. HbA1C: Recent Labs    07/31/21 1104  HGBA1C 5.8*   CBG: Recent Labs  Lab 08/01/21 1149 08/01/21 1630 08/01/21 2134 08/02/21 0810 08/02/21 1147  GLUCAP 306* 110* 202* 125* 240*   Lipid Profile: Recent Labs    08/01/21 0519  CHOL 135  HDL 55  LDLCALC 67  TRIG 64  CHOLHDL 2.5   Thyroid Function Tests: No results for input(s): TSH, T4TOTAL, FREET4, T3FREE, THYROIDAB in the last 72 hours. Anemia Panel: No results for input(s): VITAMINB12, FOLATE, FERRITIN, TIBC, IRON, RETICCTPCT in the last 72 hours. Urine analysis: No results found for: COLORURINE, APPEARANCEUR, LABSPEC, PHURINE, GLUCOSEU, HGBUR, BILIRUBINUR, KETONESUR, PROTEINUR, UROBILINOGEN, NITRITE, LEUKOCYTESUR Sepsis Labs: _1 (procalcitonin:4,lacticidven:4)  ) Recent Results (from the past 240 hour(s))  Resp Panel by RT-PCR (Flu A&B, Covid) Nasopharyngeal Swab     Status: None   Collection Time: 07/31/21  6:09 AM   Specimen: Nasopharyngeal Swab; Nasopharyngeal(NP) swabs in vial transport medium  Result Value Ref Range Status   SARS Coronavirus 2 by RT PCR NEGATIVE NEGATIVE Final    Comment: (NOTE) SARS-CoV-2 target nucleic acids are NOT DETECTED.  The SARS-CoV-2 RNA is generally detectable in upper respiratory specimens during the acute phase of infection. The lowest concentration of SARS-CoV-2 viral copies this assay can detect is 138 copies/mL. A negative result does not preclude SARS-Cov-2 infection and should not be used as the sole basis for treatment or other patient management decisions. A negative result may occur with  improper specimen collection/handling, submission of  specimen other than nasopharyngeal swab, presence of viral mutation(s) within the areas targeted by this assay, and inadequate number of viral copies(<138 copies/mL). A negative result must be combined with clinical observations, patient history, and epidemiological information. The expected result is Negative.  Fact Sheet for Patients:  EntrepreneurPulse.com.au  Fact Sheet for Healthcare Providers:  IncredibleEmployment.be  This test is no t yet approved or cleared by the [place] FDA and  has been authorized for detection and/or diagnosis of SARS-CoV-2 by FDA under an Emergency Use Authorization (EUA). This EUA will remain  in effect (meaning this test can be used) for the duration of the COVID-19 declaration under Section 564(b)(1) of the Act, 21 U.S.C.section 360bbb-3(b)(1), unless the authorization is terminated  or revoked sooner.       Influenza A by PCR NEGATIVE NEGATIVE Final   Influenza B by PCR NEGATIVE NEGATIVE Final    Comment: (NOTE) The Xpert Xpress SARS-CoV-2/FLU/RSV plus assay is intended as an aid in the diagnosis of influenza from Nasopharyngeal swab specimens and should not be used as a sole basis for treatment. Nasal washings and aspirates are unacceptable for Xpert Xpress SARS-CoV-2/FLU/RSV testing.  Fact  Sheet for Patients: EntrepreneurPulse.com.au  Fact Sheet for Healthcare Providers: IncredibleEmployment.be  This test is not yet approved or cleared by the [place] FDA and has been authorized for detection and/or diagnosis of SARS-CoV-2 by FDA under an Emergency Use Authorization (EUA). This EUA will remain in effect (meaning this test can be used) for the duration of the COVID-19 declaration under Section 564(b)(1) of the Act, 21 U.S.C. section 360bbb-3(b)(1), unless the authorization is terminated or revoked.  Performed at [facility], [address] [place], [place] [address]   Blood Culture (routine x 2)     Status: None (Preliminary result)   Collection Time: 07/31/21  7:12 AM   Specimen: BLOOD  Result Value Ref Range Status   Specimen Description BLOOD BLOOD LEFT FOREARM  Final   Special Requests   Final    BOTTLES DRAWN AEROBIC AND ANAEROBIC Blood Culture adequate volume   Culture   Final    NO GROWTH 2 DAYS Performed at [facility], [address] [place], [place] [address]    Report Status PENDING  Incomplete  Blood Culture (routine x 2)     Status: None (Preliminary result)   Collection Time: 07/31/21  7:17 AM   Specimen: BLOOD  Result Value Ref Range Status   Specimen Description BLOOD BLOOD LEFT HAND  Final   Special Requests   Final    BOTTLES DRAWN AEROBIC AND ANAEROBIC Blood Culture adequate volume   Culture   Final    NO GROWTH 2 DAYS Performed at [facility], [address], [place], [place] [address]    Report Status PENDING  Incomplete  Expectorated Sputum Assessment w Gram Stain, Rflx to Resp Cult     Status: None   Collection Time: 08/01/21  8:24 AM   Specimen: Expectorated Sputum  Result Value Ref Range Status   Specimen Description EXPECTORATED SPUTUM  Final   Special Requests NONE  Final   Sputum evaluation   Final    THIS SPECIMEN IS ACCEPTABLE FOR SPUTUM CULTURE Performed at [facility], [address] [place], [place] [address]    Report Status 08/01/2021 FINAL  Final  Culture, Respiratory w Gram Stain     Status: None (Preliminary result)   Collection Time: 08/01/21  8:24 AM  Result Value Ref Range Status   Specimen Description   Final    EXPECTORATED SPUTUM Performed at [facility], [address], [place], [place] [address]    Special Requests   Final    NONE Reflexed from [id number] Performed at [facility], [address] [place], [place] [address]    Gram Stain   Final    FEW WBC PRESENT,BOTH PMN AND MONONUCLEAR FEW SQUAMOUS  EPITHELIAL CELLS PRESENT FEW GRAM POSITIVE COCCI FEW GRAM VARIABLE ROD RARE YEAST    Culture   Final    CULTURE REINCUBATED FOR BETTER GROWTH Performed at [facility], [place] [address] [place], [place] [address]    Report Status PENDING  Incomplete         Radiology Studies: ECHOCARDIOGRAM COMPLETE  Result Date: 08/02/2021    ECHOCARDIOGRAM REPORT   Patient Name:   Tyler [name] Date of Exam: 08/02/2021 Medical Rec #:  [id number]          Height:       69.0 in Accession #:    [id number]         Weight:       165.0 lb Date  of Birth:  1950/12/28          BSA:          1.904 m Patient Age:    82 years           BP:           128/71 mmHg Patient Gender: M                  HR:           70 bpm. Exam Location:  [facility] Procedure: 2D Echo, Cardiac Doppler and Color Doppler Indications:     NSTEMI I21.4  History:         Patient has no prior history of Echocardiogram examinations.                  COPD; Risk Factors:Diabetes and Hypertension.  Sonographer:     Sherrie Sport Referring Phys:  [id number] Kathrin Ruddy ORGEL Diagnosing Phys: Isaias Cowman MD  Sonographer Comments: No parasternal window and no apical window. Image acquisition challenging due to COPD and The only obtainable view was subcostal. IMPRESSIONS  1. Left ventricular ejection fraction, by estimation, is 60 to 65%. The left ventricle has normal function. The left ventricle has no regional wall motion abnormalities. Left ventricular diastolic parameters are indeterminate.  2. Right ventricular systolic function is normal. The right ventricular size is normal.  3. The mitral valve is normal in structure. Mild mitral valve regurgitation. No evidence of mitral stenosis.  4. The aortic valve is normal in structure. Aortic valve regurgitation is not visualized. No aortic stenosis is present.  5. The inferior vena cava is normal in size with greater than 50% respiratory variability, suggesting right atrial pressure of 3 mmHg. FINDINGS  Left  Ventricle: Left ventricular ejection fraction, by estimation, is 60 to 65%. The left ventricle has normal function. The left ventricle has no regional wall motion abnormalities. The left ventricular internal cavity size was normal in size. There is  no left ventricular hypertrophy. Left ventricular diastolic parameters are indeterminate. Right Ventricle: The right ventricular size is normal. No increase in right ventricular wall thickness. Right ventricular systolic function is normal. Left Atrium: Left atrial size was normal in size. Right Atrium: Right atrial size was normal in size. Pericardium: There is no evidence of pericardial effusion. Mitral Valve: The mitral valve is normal in structure. Mild mitral valve regurgitation. No evidence of mitral valve stenosis. Tricuspid Valve: The tricuspid valve is normal in structure. Tricuspid valve regurgitation is mild . No evidence of tricuspid stenosis. Aortic Valve: The aortic valve is normal in structure. Aortic valve regurgitation is not visualized. No aortic stenosis is present. Pulmonic Valve: The pulmonic valve was normal in structure. Pulmonic valve regurgitation is not visualized. No evidence of pulmonic stenosis. Aorta: The aortic root is normal in size and structure. Venous: The inferior vena cava is normal in size with greater than 50% respiratory variability, suggesting right atrial pressure of 3 mmHg. IAS/Shunts: No atrial level shunt detected by color flow Doppler.  LEFT VENTRICLE PLAX 2D LVIDd:         3.20 cm LVIDs:         2.20 cm LV PW:         1.40 cm LV IVS:        1.10 cm  LEFT ATRIUM         Index LA diam:    4.00 cm 2.10 cm/m  PULMONIC VALVE PV Vmax:  1.19 m/s PV Vmean:       84.000 cm/s PV VTI:         0.197 m PV Peak grad:   5.7 mmHg PV Mean grad:   3.0 mmHg RVOT Peak grad: 5 mmHg   SHUNTS Pulmonic VTI: 0.202 m Isaias Cowman MD Electronically signed by Isaias Cowman MD Signature Date/Time: 08/02/2021/1:35:49 PM    Final          Scheduled Meds:  aspirin  81 mg Oral Daily   atorvastatin  40 mg Oral QHS   ciprofloxacin  500 mg Oral BID   insulin aspart  0-15 Units Subcutaneous TID AC & HS   insulin glargine-yfgn  10 Units Subcutaneous Daily   ipratropium-albuterol  3 mL Nebulization BID   lisinopril  10 mg Oral Daily   mometasone-formoterol  2 puff Inhalation BID   predniSONE  40 mg Oral Q breakfast   Continuous Infusions:     LOS: 2 days    Time spent: 25 min    Desma Maxim, MD [facility]   If 7PM-7AM, please contact night-coverage [email or web address] Password [email or web address] 08/02/2021, 2:04 PM

## 2021-08-02 NOTE — Progress Notes (Signed)
Patient irritated wanting to walk by himself around the unit. Explained to patient that he is still unsteady on his feet and will need portable oxygen d/t to current situation. Patient started getting agitated when notified that RN or other nursing staff will walk with him as soon as available but not at the very moment. After 10 minutes; I walked with patient (cane and oxygen were used). Patient noted to be slightly unsteady and needed contact guard assist. Patient c/o SOB, oxygen sat 79% @ 4LNC. Patient assisted back to room. O2 sat went up to 94% after few minutes.

## 2021-08-02 NOTE — Progress Notes (Signed)
*  PRELIMINARY RESULTS* °Echocardiogram °2D Echocardiogram has been performed. ° °Tyler [name] °08/02/2021, 8:49 AM °

## 2021-08-02 NOTE — TOC Progression Note (Signed)
Transition of Care [facility]) - Progression Note    Patient Details  Name: Tyler [name] MRN: [id number] Date of Birth: 1950/07/15  [facility]) CM/SW [place], RN Phone Number: 08/02/2021, 11:56 AM  Clinical Narrative:  Spoke with patient about recommendation for rolling walker, RW. Patient declines RW says he feels it is not needed. PT/Attending and Nurse notified.         Expected Discharge Plan and Services                                                 Social Determinants of Health (SDOH) Interventions    Readmission Risk Interventions No flowsheet data found.

## 2021-08-03 LAB — GLUCOSE, CAPILLARY
Glucose-Capillary: 101 mg/dL — ABNORMAL HIGH (ref 70–99)
Glucose-Capillary: 207 mg/dL — ABNORMAL HIGH (ref 70–99)
Glucose-Capillary: 195 mg/dL — ABNORMAL HIGH (ref 70–99)
Glucose-Capillary: 127 mg/dL — ABNORMAL HIGH (ref 70–99)

## 2021-08-03 LAB — CULTURE, RESPIRATORY W GRAM STAIN: Culture: NORMAL

## 2021-08-03 MED ORDER — ENOXAPARIN SODIUM 40 MG/0.4ML IJ SOSY
40.0000 mg | PREFILLED_SYRINGE | INTRAMUSCULAR | Status: DC
  Administered 2021-08-03: 40 mg via SUBCUTANEOUS
  Filled 2021-08-03: qty 0.4

## 2021-08-03 MED ORDER — INSULIN GLARGINE-YFGN 100 UNIT/ML ~~LOC~~ SOLN
8.0000 [IU] | Freq: Every day | SUBCUTANEOUS | Status: DC
  Administered 2021-08-04: 8 [IU] via SUBCUTANEOUS
  Filled 2021-08-03: qty 0.08

## 2021-08-03 NOTE — TOC Initial Note (Addendum)
Transition of Care [facility]) - Initial/Assessment Note    Patient Details  Name: Tyler [name] MRN: [id number] Date of Birth: 05-10-1951  Transition of Care [facility] CM/SW Contact:    Candie Chroman, LCSW Phone Number: 08/03/2021, 12:56 PM  Clinical Narrative:  CSW met with patient. No supports at bedside. CSW introduced role and explained  that PT recommendations would be discussed. Patient agreeable to CSW looking into home health. [facility] is reviewing referral. Patient is on acute oxygen. Will follow for potential need at discharge. No further concerns. CSW encouraged patient to contact CSW as needed. CSW will continue to follow patient for support and facilitate return home when stable.  2:11 pm: [facility] can accept but cannot start services until sometime next week. Checking with other agencies to see if anyone can see him sooner. [facility] does not have Chief of Staff. [facility] is checking with their [facility] office. Nanine Means is reviewing. [facility] said no. Left messages for [facility] and [employer].  Expected Discharge Plan: [place] Barriers to Discharge: Continued Medical Work up   Patient Goals and CMS Choice        Expected Discharge Plan and Services Expected Discharge Plan: [place]       Living arrangements for the past 2 months: Apartment                                      Prior Living Arrangements/Services Living arrangements for the past 2 months: Apartment Lives with:: Siblings Patient language and need for interpreter reviewed:: Yes Do you feel safe going back to the place where you live?: Yes      Need for Family Participation in Patient Care: Yes (Comment) Care giver support system in place?: Yes (comment)   Criminal Activity/Legal Involvement Pertinent to Current Situation/Hospitalization: No - Comment as needed  Activities of Daily Living Home Assistive Devices/Equipment: Cane (specify  quad or straight) ADL Screening (condition at time of admission) Patient's cognitive ability adequate to safely complete daily activities?: Yes Is the patient deaf or have difficulty hearing?: No Does the patient have difficulty seeing, even when wearing glasses/contacts?: No Does the patient have difficulty concentrating, remembering, or making decisions?: No Patient able to express need for assistance with ADLs?: Yes Does the patient have difficulty dressing or bathing?: No Independently performs ADLs?: Yes (appropriate for developmental age) Does the patient have difficulty walking or climbing stairs?: Yes (SOB) Weakness of Legs: None Weakness of Arms/Hands: None  Permission Sought/Granted Permission sought to share information with : Facility Art therapist granted to share information with : Yes, Verbal Permission Granted     Permission granted to share info w AGENCY: Home Health Agencies        Emotional Assessment Appearance:: Appears stated age Attitude/Demeanor/Rapport: Engaged, Gracious Affect (typically observed): Accepting, Appropriate, Calm, Pleasant Orientation: : Oriented to Self, Oriented to Place, Oriented to  Time, Oriented to Situation Alcohol / Substance Use: Not Applicable Psych Involvement: No (comment)  Admission diagnosis:  SOB (shortness of breath) [R06.02] Atypical pneumonia [J18.9] COPD exacerbation (HCC) [J44.1] COPD with acute exacerbation (HCC) [J44.1] Sepsis, due to unspecified organism, unspecified whether acute organ dysfunction present [facility]) [A41.9] Patient Active Problem List   Diagnosis Date Noted   HTN (hypertension) 08/01/2021   COPD with acute exacerbation ([place]) 07/31/2021   Diabetes mellitus ([place]) 07/31/2021   Nicotine dependence 07/31/2021   Elevated troponin 07/31/2021  PCP:  Dion Body, MD Pharmacy:   [facility] [place], [place] [facility] [address] [place] [address] Phone: [phone] Fax: [phone]     Social Determinants of Health (SDOH) Interventions    Readmission Risk Interventions No flowsheet data found.

## 2021-08-03 NOTE — Progress Notes (Signed)
PROGRESS NOTE    Tyler [name]  HEN:[id number] DOB: [birth year] DOA: 07/31/2021 PCP: Dion Body, MD  Outpatient Specialists: pulmonology    Brief Narrative:   Hx smoking, copd, htn, dm, presenting with one day cough and shortness of breath, admitted for COPD exacerbation with acute hypoxic respiratory failure   Assessment & Plan:   Principal Problem:   COPD with acute exacerbation ([place]) Active Problems:   Diabetes mellitus ([place])   Nicotine dependence   Elevated troponin   HTN (hypertension)   # COPD with exacerbation Hx COPD, chronic smoker. Trelegy ordered by pulm at last visit but patient says he can't afford, is only taking duonebs as needed. CTA neg for PE but findings read as suggestive of mycobacteria. Recently treated with azithromycin. Symptomatically improving from presentation. - cont cipro 500 bid (abx started 2/4) - continue steroids (started 2/4) - sputum for culture, ngtd - touched base w/ Dr. 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 He advises treating for exacerbation as we currently are, with outpt pulm f/u for MAC w/u. - PT advises rolling walker, patient refuses. Also advises HH PT/OT, patient considering  # Acute hypoxic respiratory failure 2/2 copd exacerbation. Improving but O2 87% on room air today - cont [place] o2, wean as able  # Demand ischemia Cardiology has seen, thinks demand from acute illness, likely CAD. TTE unremarkable. - statin switched to atorva, aspirin started - will need outpt cardiology f/u for ischemic w/u - stable, will d/c tele today  # HTN Here bp wnl - cont home lisinopril  # T2DM Here glucose mildly elevated in setting of steroids, now normalized with institution of basal insulin - SSI - cont semglee     DVT prophylaxis: lovenox Code Status: DNR Family Communication: sister updated telephonically 2/7  Level of care: Progressive Status is: Inpatient Remains inpatient appropriate because: severity of  illness    Consultants:  cardiology  Procedures: none  Antimicrobials:  Azith/ceftriaxone>cipro    Subjective: This morning says breathing feeling improved. No chest pain.    Objective: Vitals:   08/03/21 0803 08/03/21 0859 08/03/21 0900 08/03/21 1142  BP: 133/76   121/82  Pulse: 78   82  Resp: 18   18  Temp: 98.1 F (36.7 C)   98 F (36.7 C)  TempSrc: Oral     SpO2: 95% (!) 87% 94% 94%  Weight:      Height:        Intake/Output Summary (Last 24 hours) at 08/03/2021 1249 Last data filed at 08/02/2021 2000 Gross per 24 hour  Intake 240 ml  Output --  Net 240 ml   Filed Weights   07/31/21 0556  Weight: 74.8 kg    Examination:  General exam: Appears calm and comfortable  Respiratory system: clear, decreased breath sounds Cardiovascular system: S1 & S2 heard, RRR. No JVD, murmurs, rubs, gallops or clicks. No pedal edema. Gastrointestinal system: Abdomen is nondistended, soft and nontender. No organomegaly or masses felt. Normal bowel sounds heard. Central nervous system: Alert and oriented. No focal neurological deficits. Extremities: Symmetric 5 x 5 power. Skin: No rashes, lesions or ulcers Psychiatry: Judgement and insight appear normal. Mood & affect appropriate.     Data Reviewed: I have personally reviewed following labs and imaging studies  CBC: Recent Labs  Lab 07/31/21 0600 08/01/21 0519  WBC 14.9* 16.8*  HGB 13.5 11.9*  HCT 41.6 36.1*  MCV 92.9 91.2  PLT 268 361   Basic Metabolic Panel: Recent Labs  Lab 07/31/21  0600 08/01/21 0519 08/02/21 0350  NA 138 139 140  K 3.8 4.0 4.6  CL 105 106 105  CO2 _0 GLUCOSE 194* 171* 117*  BUN 15 18 29*  CREATININE 0.73 0.73 0.83  CALCIUM 9.1 9.1 8.9   GFR: Estimated Creatinine Clearance: 80.4 mL/min (by C-G formula based on SCr of 0.83 mg/dL). Liver Function Tests: Recent Labs  Lab 08/01/21 0519  AST 25  ALT 29  ALKPHOS 69  BILITOT 0.4  PROT 6.8  ALBUMIN 3.2*   No results for  input(s): LIPASE, AMYLASE in the last 168 hours. No results for input(s): AMMONIA in the last 168 hours. Coagulation Profile: No results for input(s): INR, PROTIME in the last 168 hours. Cardiac Enzymes: No results for input(s): CKTOTAL, CKMB, CKMBINDEX, TROPONINI in the last 168 hours. BNP (last 3 results) No results for input(s): PROBNP in the last 8760 hours. HbA1C: No results for input(s): HGBA1C in the last 72 hours.  CBG: Recent Labs  Lab 08/02/21 1147 08/02/21 1617 08/02/21 2001 08/03/21 0806 08/03/21 1143  GLUCAP 240* 173* 140* 101* 207*   Lipid Profile: Recent Labs    08/01/21 0519  CHOL 135  HDL 55  LDLCALC 67  TRIG 64  CHOLHDL 2.5   Thyroid Function Tests: No results for input(s): TSH, T4TOTAL, FREET4, T3FREE, THYROIDAB in the last 72 hours. Anemia Panel: No results for input(s): VITAMINB12, FOLATE, FERRITIN, TIBC, IRON, RETICCTPCT in the last 72 hours. Urine analysis: No results found for: COLORURINE, APPEARANCEUR, LABSPEC, PHURINE, GLUCOSEU, HGBUR, BILIRUBINUR, KETONESUR, PROTEINUR, UROBILINOGEN, NITRITE, LEUKOCYTESUR Sepsis Labs: _1 (procalcitonin:4,lacticidven:4)  ) Recent Results (from the past 240 hour(s))  Resp Panel by RT-PCR (Flu A&B, Covid) Nasopharyngeal Swab     Status: None   Collection Time: 07/31/21  6:09 AM   Specimen: Nasopharyngeal Swab; Nasopharyngeal(NP) swabs in vial transport medium  Result Value Ref Range Status   SARS Coronavirus 2 by RT PCR NEGATIVE NEGATIVE Final    Comment: (NOTE) SARS-CoV-2 target nucleic acids are NOT DETECTED.  The SARS-CoV-2 RNA is generally detectable in upper respiratory specimens during the acute phase of infection. The lowest concentration of SARS-CoV-2 viral copies this assay can detect is 138 copies/mL. A negative result does not preclude SARS-Cov-2 infection and should not be used as the sole basis for treatment or other patient management decisions. A negative result may occur with   improper specimen collection/handling, submission of specimen other than nasopharyngeal swab, presence of viral mutation(s) within the areas targeted by this assay, and inadequate number of viral copies(<138 copies/mL). A negative result must be combined with clinical observations, patient history, and epidemiological information. The expected result is Negative.  Fact Sheet for Patients:  EntrepreneurPulse.com.au  Fact Sheet for Healthcare Providers:  IncredibleEmployment.be  This test is no t yet approved or cleared by the [place] FDA and  has been authorized for detection and/or diagnosis of SARS-CoV-2 by FDA under an Emergency Use Authorization (EUA). This EUA will remain  in effect (meaning this test can be used) for the duration of the COVID-19 declaration under Section 564(b)(1) of the Act, 21 U.S.C.section 360bbb-3(b)(1), unless the authorization is terminated  or revoked sooner.       Influenza A by PCR NEGATIVE NEGATIVE Final   Influenza B by PCR NEGATIVE NEGATIVE Final    Comment: (NOTE) The Xpert Xpress SARS-CoV-2/FLU/RSV plus assay is intended as an aid in the diagnosis of influenza from Nasopharyngeal swab specimens and should not be used as a sole basis for treatment.  Nasal washings and aspirates are unacceptable for Xpert Xpress SARS-CoV-2/FLU/RSV testing.  Fact Sheet for Patients: EntrepreneurPulse.com.au  Fact Sheet for Healthcare Providers: IncredibleEmployment.be  This test is not yet approved or cleared by the [place] FDA and has been authorized for detection and/or diagnosis of SARS-CoV-2 by FDA under an Emergency Use Authorization (EUA). This EUA will remain in effect (meaning this test can be used) for the duration of the COVID-19 declaration under Section 564(b)(1) of the Act, 21 U.S.C. section 360bbb-3(b)(1), unless the authorization is terminated  or revoked.  Performed at [facility], [address] [place], [place] [address]   Blood Culture (routine x 2)     Status: None (Preliminary result)   Collection Time: 07/31/21  7:12 AM   Specimen: BLOOD  Result Value Ref Range Status   Specimen Description BLOOD BLOOD LEFT FOREARM  Final   Special Requests   Final    BOTTLES DRAWN AEROBIC AND ANAEROBIC Blood Culture adequate volume   Culture   Final    NO GROWTH 2 DAYS Performed at [facility], [address], [place], [place] [address]    Report Status PENDING  Incomplete  Blood Culture (routine x 2)     Status: None (Preliminary result)   Collection Time: 07/31/21  7:17 AM   Specimen: BLOOD  Result Value Ref Range Status   Specimen Description BLOOD BLOOD LEFT HAND  Final   Special Requests   Final    BOTTLES DRAWN AEROBIC AND ANAEROBIC Blood Culture adequate volume   Culture   Final    NO GROWTH 2 DAYS Performed at [facility], [address] [place], [place] [address]    Report Status PENDING  Incomplete  Expectorated Sputum Assessment w Gram Stain, Rflx to Resp Cult     Status: None   Collection Time: 08/01/21  8:24 AM   Specimen: Expectorated Sputum  Result Value Ref Range Status   Specimen Description EXPECTORATED SPUTUM  Final   Special Requests NONE  Final   Sputum evaluation   Final    THIS SPECIMEN IS ACCEPTABLE FOR SPUTUM CULTURE Performed at [facility], [address], [place], [place] [address]    Report Status 08/01/2021 FINAL  Final  Culture, Respiratory w Gram Stain     Status: None   Collection Time: 08/01/21  8:24 AM  Result Value Ref Range Status   Specimen Description   Final    EXPECTORATED SPUTUM Performed at [facility], [address], [place], [place] [address]    Special Requests   Final    NONE Reflexed from [id number] Performed at [facility], [address] [place], [place] [address]    Gram Stain   Final    FEW WBC  PRESENT,BOTH PMN AND MONONUCLEAR FEW SQUAMOUS EPITHELIAL CELLS PRESENT FEW GRAM POSITIVE COCCI FEW GRAM VARIABLE ROD RARE YEAST    Culture   Final    MODERATE Normal respiratory flora-no Staph aureus or Pseudomonas seen Performed at [facility], [place] [address] [place], [place] [address]    Report Status 08/03/2021 FINAL  Final         Radiology Studies: ECHOCARDIOGRAM COMPLETE  Result Date: 08/02/2021    ECHOCARDIOGRAM REPORT   Patient Name:   Tyler [name] Date of Exam: 08/02/2021 Medical Rec #:  [id number]          Height:       69.0 in Accession #:    [id number]  Weight:       165.0 lb Date of Birth:  02/13/1951          BSA:          1.904 m Patient Age:    21 years           BP:           128/71 mmHg Patient Gender: M                  HR:           70 bpm. Exam Location:  [facility] Procedure: 2D Echo, Cardiac Doppler and Color Doppler Indications:     NSTEMI I21.4  History:         Patient has no prior history of Echocardiogram examinations.                  COPD; Risk Factors:Diabetes and Hypertension.  Sonographer:     Sherrie Sport Referring Phys:  [id number] Kathrin Ruddy ORGEL Diagnosing Phys: Isaias Cowman MD  Sonographer Comments: No parasternal window and no apical window. Image acquisition challenging due to COPD and The only obtainable view was subcostal. IMPRESSIONS  1. Left ventricular ejection fraction, by estimation, is 60 to 65%. The left ventricle has normal function. The left ventricle has no regional wall motion abnormalities. Left ventricular diastolic parameters are indeterminate.  2. Right ventricular systolic function is normal. The right ventricular size is normal.  3. The mitral valve is normal in structure. Mild mitral valve regurgitation. No evidence of mitral stenosis.  4. The aortic valve is normal in structure. Aortic valve regurgitation is not visualized. No aortic stenosis is present.  5. The inferior vena cava is normal in size with greater than 50%  respiratory variability, suggesting right atrial pressure of 3 mmHg. FINDINGS  Left Ventricle: Left ventricular ejection fraction, by estimation, is 60 to 65%. The left ventricle has normal function. The left ventricle has no regional wall motion abnormalities. The left ventricular internal cavity size was normal in size. There is  no left ventricular hypertrophy. Left ventricular diastolic parameters are indeterminate. Right Ventricle: The right ventricular size is normal. No increase in right ventricular wall thickness. Right ventricular systolic function is normal. Left Atrium: Left atrial size was normal in size. Right Atrium: Right atrial size was normal in size. Pericardium: There is no evidence of pericardial effusion. Mitral Valve: The mitral valve is normal in structure. Mild mitral valve regurgitation. No evidence of mitral valve stenosis. Tricuspid Valve: The tricuspid valve is normal in structure. Tricuspid valve regurgitation is mild . No evidence of tricuspid stenosis. Aortic Valve: The aortic valve is normal in structure. Aortic valve regurgitation is not visualized. No aortic stenosis is present. Pulmonic Valve: The pulmonic valve was normal in structure. Pulmonic valve regurgitation is not visualized. No evidence of pulmonic stenosis. Aorta: The aortic root is normal in size and structure. Venous: The inferior vena cava is normal in size with greater than 50% respiratory variability, suggesting right atrial pressure of 3 mmHg. IAS/Shunts: No atrial level shunt detected by color flow Doppler.  LEFT VENTRICLE PLAX 2D LVIDd:         3.20 cm LVIDs:         2.20 cm LV PW:         1.40 cm LV IVS:        1.10 cm  LEFT ATRIUM         Index LA diam:    4.00 cm 2.10  cm/m  PULMONIC VALVE PV Vmax:        1.19 m/s PV Vmean:       84.000 cm/s PV VTI:         0.197 m PV Peak grad:   5.7 mmHg PV Mean grad:   3.0 mmHg RVOT Peak grad: 5 mmHg   SHUNTS Pulmonic VTI: 0.202 m Isaias Cowman MD Electronically signed  by Isaias Cowman MD Signature Date/Time: 08/02/2021/1:35:49 PM    Final         Scheduled Meds:  aspirin  81 mg Oral Daily   atorvastatin  40 mg Oral QHS   ciprofloxacin  500 mg Oral BID   insulin aspart  0-15 Units Subcutaneous TID AC & HS   [START ON 08/04/2021] insulin glargine-yfgn  8 Units Subcutaneous Daily   ipratropium-albuterol  3 mL Nebulization BID   lisinopril  10 mg Oral Daily   mometasone-formoterol  2 puff Inhalation BID   predniSONE  40 mg Oral Q breakfast   Continuous Infusions:     LOS: 3 days    Time spent: 25 min    Desma Maxim, MD [facility]   If 7PM-7AM, please contact night-coverage [email or web address] Password [facility] 08/03/2021, 12:49 PM

## 2021-08-03 NOTE — Progress Notes (Signed)
Physical Therapy Treatment Patient Details Name: Tyler [name] MRN: [id number] DOB: [birth year] Today's Date: 08/03/2021   History of Present Illness Pt is a 71 y.o. male with medical history significant for COPD, nicotine dependence, hypertension and diabetes mellitus who presents to the emergency room for evaluation of a 1 week history of worsening shortness of breath. MD assessment includes: COPD with exacerbation, acute hypoxic respiratory failure, and demand ischemia.    PT Comments    Pt received asleep in bed but is easily aroused to name. Agreeable to PT services. Resting on 2L/min Grav[place]. Pt remains mod-I with bed mobility and supervision to stand with SPC progressing ambulation to 200'. SPO2 remains >90% throughout but does require x1 standing rest break for ~30 sec. Minor drift R/L throughout gait but no significant LOB or need for PT assistance. Pt returned to recliner with all needs in place, performing seated therex with good form/technique with exercise. HEP provided with education on reps, sets, frequency. RN updated on SPO2 sats with OOB mobility. D/c recs remain appropriate.    Recommendations for follow up therapy are one component of a multi-disciplinary discharge planning process, led by the attending physician.  Recommendations may be updated based on patient status, additional functional criteria and insurance authorization.  Follow Up Recommendations  Home health PT     Assistance Recommended at Discharge    Patient can return home with the following A little help with walking and/or transfers;Assist for transportation   Equipment Recommendations  Rolling walker (2 wheels)    Recommendations for Other Services       Precautions / Restrictions Precautions Precautions: Fall Restrictions Weight Bearing Restrictions: No     Mobility  Bed Mobility Overal bed mobility: Modified Independent             General bed mobility comments: Min extra time and effort  only Patient Response: Cooperative  Transfers Overall transfer level: Needs assistance Equipment used: Straight cane Transfers: Sit to/from Stand Sit to Stand: Supervision           General transfer comment: bed at lowest position    Ambulation/Gait Ambulation/Gait assistance: Min guard Gait Distance (Feet): 200 Feet Assistive device: Straight cane Gait Pattern/deviations: Step-through pattern, Decreased step length - right, Decreased step length - left, Drifts right/left       General Gait Details: Remains drifting R/L but does not appear to same degree as reported at PT eval. No LOB or need for PT intervention. Remains on 2L/min  and remains 91-93% throughout.   Stairs             Wheelchair Mobility    Modified Rankin (Stroke Patients Only)       Balance Overall balance assessment: Needs assistance   Sitting balance-Leahy Scale: Normal     Standing balance support: Single extremity supported, During functional activity Standing balance-Leahy Scale: Fair                              Cognition Arousal/Alertness: Awake/alert Behavior During Therapy: WFL for tasks assessed/performed Overall Cognitive Status: Within Functional Limits for tasks assessed                                          Exercises General Exercises - Lower Extremity Long Arc Quad: AROM, Seated, Strengthening, Both, 15 reps Hip ABduction/ADduction: AROM, Seated, Strengthening,  Both, 15 reps Hip Flexion/Marching: AROM, Seated, Strengthening, Both, 15 reps Toe Raises: AROM, Seated, Strengthening, Both, 15 reps Heel Raises: AROM, Seated, Strengthening, Both, 15 reps Other Exercises Other Exercises: PLB, educated on HEP reps, sets, frequency.    General Comments        Pertinent Vitals/Pain Pain Assessment Pain Assessment: No/denies pain    Home Living                          Prior Function            PT Goals (current goals  can now be found in the care plan section) Acute Rehab PT Goals Patient Stated Goal: To get stronger PT Goal Formulation: With patient Time For Goal Achievement: 08/15/21 Potential to Achieve Goals: Good Progress towards PT goals: Progressing toward goals    Frequency    Min 2X/week      PT Plan Current plan remains appropriate    Co-evaluation              AM-PAC PT "6 Clicks" Mobility   Outcome Measure  Help needed turning from your back to your side while in a flat bed without using bedrails?: None Help needed moving from lying on your back to sitting on the side of a flat bed without using bedrails?: None Help needed moving to and from a bed to a chair (including a wheelchair)?: A Little Help needed standing up from a chair using your arms (e.g., wheelchair or bedside chair)?: A Little Help needed to walk in hospital room?: A Little Help needed climbing 3-5 steps with a railing? : A Little 6 Click Score: 20    End of Session Equipment Utilized During Treatment: Gait belt;Oxygen Activity Tolerance: Patient tolerated treatment well Patient left: in chair;with call bell/phone within reach;Other (comment) Nurse Communication: Mobility status;Other (comment) PT Visit Diagnosis: Unsteadiness on feet (R26.81);Difficulty in walking, not elsewhere classified (R26.2);Muscle weakness (generalized) (M62.81)     Time: 3818-2993 PT Time Calculation (min) (ACUTE ONLY): 25 min  Charges:  $Therapeutic Exercise: 23-37 mins                    Hommer Cunliffe M. Fairly IV, PT, DPT Physical Therapist- [place]  [facility]  08/03/2021, 12:11 PM

## 2021-08-04 ENCOUNTER — Encounter: Payer: Self-pay | Admitting: Emergency Medicine

## 2021-08-04 DIAGNOSIS — E119 Type 2 diabetes mellitus without complications: Secondary | ICD-10-CM

## 2021-08-04 LAB — CBC
HCT: 41.5 % (ref 39.0–52.0)
Hemoglobin: 13.7 g/dL (ref 13.0–17.0)
MCH: 30.1 pg (ref 26.0–34.0)
MCHC: 33 g/dL (ref 30.0–36.0)
MCV: 91.2 fL (ref 80.0–100.0)
Platelets: 312 10*3/uL (ref 150–400)
RBC: 4.55 MIL/uL (ref 4.22–5.81)
RDW: 13.9 % (ref 11.5–15.5)
WBC: 24.1 10*3/uL — ABNORMAL HIGH (ref 4.0–10.5)
nRBC: 0 % (ref 0.0–0.2)

## 2021-08-04 LAB — BASIC METABOLIC PANEL
Anion gap: 5 (ref 5–15)
BUN: 20 mg/dL (ref 8–23)
CO2: 33 mmol/L — ABNORMAL HIGH (ref 22–32)
Calcium: 8.7 mg/dL — ABNORMAL LOW (ref 8.9–10.3)
Chloride: 103 mmol/L (ref 98–111)
Creatinine, Ser: 0.6 mg/dL — ABNORMAL LOW (ref 0.61–1.24)
GFR, Estimated: >60 mL/min (ref >60)
Glucose, Bld: 102 mg/dL — ABNORMAL HIGH (ref 70–99)
Potassium: 4.4 mmol/L (ref 3.5–5.1)
Sodium: 141 mmol/L (ref 135–145)

## 2021-08-04 LAB — GLUCOSE, CAPILLARY: Glucose-Capillary: 105 mg/dL — ABNORMAL HIGH (ref 70–99)

## 2021-08-04 MED ORDER — MOMETASONE FURO-FORMOTEROL FUM 100-5 MCG/ACT IN AERO: 2.0000 | INHALATION_SPRAY | Freq: Two times a day (BID) | RESPIRATORY_TRACT | 1 refills | Status: AC

## 2021-08-04 MED ORDER — CIPROFLOXACIN HCL 500 MG PO TABS: 500.0000 mg | ORAL_TABLET | Freq: Two times a day (BID) | ORAL | 0 refills | Status: AC

## 2021-08-04 NOTE — Progress Notes (Signed)
O2 delivered. Called sister Olegario Messier for pick up and left a message.

## 2021-08-04 NOTE — Assessment & Plan Note (Addendum)
Hx COPD, chronic smoker. Trelegy ordered by pulm at last visit but patient says he can't afford, is only taking duonebs as needed. -- CTA neg for PE but findings read as suggestive of mycobacteria.  --Recently treated with azithromycin. Symptomatically improving from presentation. - cont cipro 500 bid (abx started 2/4) will give rx for 10 days for now - continue steroids (started 2/4)--completed 5 days - sputum for culture few GNR,GPC - Dr Si Raider touched base w/ Dr. 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 He advises treating for exacerbation as we currently are, with outpt pulm f/u for MAC w/u. - PT advises rolling walker and HHPT -- patient did qualify for home oxygen. Will arrange home oxygen at discharge. -- Patient will follow-up with Dr. Raul Del as outpatient -- he will continue his nebulizer and inhalers at home -- elevated white count suspected reactive due to steroids.

## 2021-08-04 NOTE — Assessment & Plan Note (Signed)
Cardiology has seen, thinks demand from acute illness, likely CAD. TTE unremarkable. - statin, aspirin started - stable -- patient to follow-up with PCP

## 2021-08-04 NOTE — Care Management Important Message (Signed)
Important Message  Patient Details  Name: Tyler [name] MRN: [id number] Date of Birth: October 05, 1950   Medicare Important Message Given:  Yes     Dannette Barbara 08/04/2021, 10:50 AM

## 2021-08-04 NOTE — Assessment & Plan Note (Signed)
Here glucose mildly elevated in setting of steroids, now normalized with institution of basal insulin - SSI - cont semglee  -- will resume home meds at discharge

## 2021-08-04 NOTE — TOC Transition Note (Addendum)
Transition of Care [facility]) - CM/SW Discharge Note   Patient Details  Name: Tyler [name] MRN: [id number] Date of Birth: [birth year]  [facility] CM/SW Contact:  Gildardo Griffes, LCSW Phone Number: 08/04/2021, 9:25 AM   Clinical Narrative:     Patient to discharge today home with home health PT and RN, CSW has informed Maralyn Sago with [facility].   Patient will receive walker and oxygen delivered to room via [facility] with [employer], [employer] will follow up with home oxygen set up.   Patient's sister to pick up today.   No further discharge needs identified.    Update: patient declined rolling walker, oxygen delivered to room by [employer].     Final next level of care: Home w Home Health Services Barriers to Discharge: No Barriers Identified   Patient Goals and CMS Choice Patient states their goals for this hospitalization and ongoing recovery are:: to go home CMS Medicare.gov Compare Post Acute Care list provided to:: Patient Choice offered to / list presented to : Patient  Discharge Placement                  Name of family member notified: sister Patient and family notified of of transfer: 08/04/21  Discharge Plan and Services                DME Arranged: Dan Humphreys rolling, Oxygen DME Agency: [employer] Date DME Agency Contacted: 08/04/21 Time DME Agency Contacted: [phone] Representative spoke with at DME Agency: Duwayne Heck HH Arranged: PT, OT HH Agency: [facility] Date [facility] Agency Contacted: 08/04/21 Time HH Agency Contacted: [phone] Representative spoke with at [facility] Agency: Maralyn Sago  Social Determinants of Health (SDOH) Interventions     Readmission Risk Interventions No flowsheet data found.

## 2021-08-04 NOTE — Discharge Summary (Signed)
Physician Discharge Summary   Patient: Tyler [name] MRN: [id number] DOB: Jul 06, 1950  Admit date:     07/31/2021  Discharge date: 08/04/21  Discharge Physician: Fritzi Mandes   PCP: Dion Body, MD   Recommendations at discharge:   patient to use his oxygen as instructed. Use inhalers and nebulizer as before. Patient to follow-up with pulmonary Dr. 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 Trelegy ordered by pulm at last visit but patient says he can't afford, is only taking duonebs as needed. -- CTA neg for PE but findings read as suggestive of mycobacteria.  --Recently treated with azithromycin. Symptomatically improving from presentation. - cont cipro 500 bid (abx started 2/4) will give rx for 10 days for now - continue steroids (started 2/4)--completed 5 days - sputum for culture few GNR,GPC - Dr Si Raider touched base w/ Dr. 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 He advises treating for exacerbation as we currently are, with outpt pulm f/u for MAC w/u. - PT advises rolling walker and HHPT -- patient did qualify for home oxygen. Will arrange home oxygen at discharge. -- Patient will follow-up with Dr. Raul Del as outpatient -- he will continue his nebulizer and inhalers at home -- elevated white count suspected reactive due to steroids.  HTN (hypertension) Continue home lisinopril blood pressure stable  Elevated troponin- (present on admission) Cardiology has seen, thinks demand from acute illness, likely CAD. TTE unremarkable. - statin, aspirin started - stable -- patient to follow-up with PCP    Nicotine dependence- (present on admission) Advised smoking cessation  Diabetes mellitus ([place]) Here glucose mildly elevated in setting of steroids, now normalized with institution of basal insulin - SSI -  cont semglee  -- will resume home meds at discharge             Consultants: cardiology Procedures performed: none Disposition: Home health Diet recommendation:  Discharge Diet Orders (From admission, onward)     Start     Ordered   08/04/21 0000  Diet - low sodium heart healthy        08/04/21 0921           Cardiac and Carb modified diet  DISCHARGE MEDICATION: Allergies as of 08/04/2021   Not on File      Medication List     STOP taking these medications    azithromycin 250 MG tablet Commonly known as: ZITHROMAX       TAKE these medications    albuterol 108 (90 Base) MCG/ACT inhaler Commonly known as: VENTOLIN HFA Inhale 2 puffs into the lungs every 6 (six) hours as needed.   aspirin EC 81 MG tablet Take 81 mg by mouth daily. Swallow whole.   ciprofloxacin 500 MG tablet Commonly known as: CIPRO Take 1 tablet (500 mg total) by mouth 2 (two) times daily for 10 days.   ipratropium-albuterol 0.5-2.5 (3) MG/3ML Soln Commonly known as: DUONEB Take 3 mLs by nebulization 4 (four) times daily.   lisinopril 10 MG tablet Commonly known as: ZESTRIL Take 10 mg by mouth daily.   metFORMIN 500 MG tablet Commonly known as: GLUCOPHAGE Take 500 mg by mouth daily.   mometasone-formoterol 100-5 MCG/ACT Aero Commonly known as: DULERA Inhale 2 puffs into the lungs 2 (two) times daily.   simvastatin 40 MG tablet Commonly known as: ZOCOR Take 40 mg by mouth daily.   vitamin C 1000  MG tablet Take 1,000 mg by mouth daily.               Durable Medical Equipment  (From admission, onward)           Start     Ordered   08/04/21 0917  For home use only DME Walker rolling  Once       Question Answer Comment  Walker: With 5 Inch Wheels   Patient needs a walker to treat with the following condition COPD (chronic obstructive pulmonary disease) ([place])      08/04/21 6063   08/04/21 0828  For home use only DME oxygen  Once       Question Answer  Comment  Length of Need Lifetime   Mode or (Route) Nasal cannula   Liters per Minute 2   Frequency Continuous (stationary and portable oxygen unit needed)   Oxygen conserving device Yes   Oxygen delivery system Gas      08/04/21 0827            Follow-up Information     Erby Pian, MD. Schedule an appointment as soon as possible for a visit in 1 week(s).   Specialty: Specialist Why: Emphysema Contact information: [place] [address] [phone]         Dion Body, MD. Schedule an appointment as soon as possible for a visit in 1 week(s).   Specialty: Family Medicine Why: hopsital f/u Contact information: [place] [address] [phone]                 Discharge Exam: Danley Danker Weights   07/31/21 0556  Weight: 74.8 kg   no respiratory distress.  Condition at discharge: stable  The results of significant diagnostics from this hospitalization (including imaging, microbiology, ancillary and laboratory) are listed below for reference.   Imaging Studies: DG Chest 1 View  Result Date: 07/31/2021 CLINICAL DATA:  71 year old male with increasing shortness of breath. EXAM: CHEST  1 VIEW COMPARISON:  None. FINDINGS: Portable AP upright view at 0632 hours. Normal cardiac size and mediastinal contours. Large lung volumes. Visualized tracheal air column is within normal limits. No pneumothorax, pleural effusion or confluent pulmonary opacity. But diffuse increased pulmonary interstitial opacity, somewhat coarse and basilar predominant. No confluent opacity. Partially visible cervical ACDF. No acute osseous abnormality identified. Negative visible bowel gas. IMPRESSION: 1. Evidence of pulmonary hyperinflation with diffuse increased interstitial opacity. Top differential considerations are acute interstitial edema, viral/atypical respiratory infection, chronic interstitial lung disease. 2. No  cardiomegaly.  No pleural effusion is evident. Electronically Signed   By: Genevie Ann M.D.   On: 07/31/2021 06:57   CT CHEST WO CONTRAST  Result Date: 07/31/2021 CLINICAL DATA:  Shortness of breath. EXAM: CT CHEST WITHOUT CONTRAST TECHNIQUE: Multidetector CT imaging of the chest was performed following the standard protocol without IV contrast. RADIATION DOSE REDUCTION: This exam was performed according to the departmental dose-optimization program which includes automated exposure control, adjustment of the mA and/or kV according to patient size and/or use of iterative reconstruction technique. COMPARISON:  None. FINDINGS: Cardiovascular: Normal heart size. No pericardial effusion. Aortic atherosclerosis and 3 vessel coronary artery calcifications. No pericardial effusion Mediastinum/Nodes: No enlarged mediastinal or axillary lymph nodes. Thyroid gland, trachea, and esophagus demonstrate no significant findings. Lungs/Pleura: Moderate paraseptal and centrilobular emphysema identified. Diffuse bronchial wall thickening is identified bilaterally. Mild cylindrical bronchiectasis noted. There is no pleural effusion, airspace consolidation or pneumothorax. No significant  subpleural reticulation, honeycombing or ground-glass attenuation. -Scattered peripheral predominant tree-in-bud nodules are identified within both lungs compatible with inflammatory or infectious bronchiolitis, for example image 69/3. -There also scattered areas of mucoid impaction within dilated peripheral bronchials, for example, image 144/3. -Perifissural nodule along the minor fissure measures 6 mm, image 99/3. Perifissural nodule along the posterior major fissure measures 5 mm, image 74/3. These likely represent a benign intrapulmonary lymph nodes. -Subpleural nodule within the lateral left base measures 6 mm, image 139/3. -Peripheral nodule in the superior segment of the right lower lobe measures 5 mm, image 88/3. Upper Abdomen: No acute  abnormality within the imaged portions of the upper abdomen. Musculoskeletal: No chest wall mass or suspicious bone lesions identified. IMPRESSION: 1. Diffuse bronchial wall thickening with emphysema, as above; imaging findings suggestive of underlying COPD. Mild diffuse cylindrical bronchiectasis is identified likely the sequelae of chronic inflammation/infection. 2. Scattered peripheral predominant tree-in-bud nodules are identified within both lungs compatible with inflammatory or infectious bronchiolitis. 3. Left lung base and right lower lobe lung nodules measure up to 6 mm. Non-contrast chest CT at 3-6 months is recommended. If the nodules are stable at time of repeat CT, then future CT at 18-24 months (from today's scan) is considered optional for low-risk patients, but is recommended for high-risk patients. This recommendation follows the consensus statement: Guidelines for Management of Incidental Pulmonary Nodules Detected on CT Images:From the Fleischner Society 2017; published online before print (10.1148/radiol.[id number]). 4. Aortic Atherosclerosis (ICD10-I70.0) and Emphysema (ICD10-J43.9). Electronically Signed   By: Kerby Moors M.D.   On: 07/31/2021 08:41   CT Angio Chest Pulmonary Embolism (PE) W or WO Contrast  Result Date: 07/31/2021 CLINICAL DATA:  PE suspected EXAM: CT ANGIOGRAPHY CHEST WITH CONTRAST TECHNIQUE: Multidetector CT imaging of the chest was performed using the standard protocol during bolus administration of intravenous contrast. Multiplanar CT image reconstructions and MIPs were obtained to evaluate the vascular anatomy. RADIATION DOSE REDUCTION: This exam was performed according to the departmental dose-optimization program which includes automated exposure control, adjustment of the mA and/or kV according to patient size and/or use of iterative reconstruction technique. CONTRAST:  78m OMNIPAQUE IOHEXOL 350 MG/ML SOLN COMPARISON:  Noncontrast CT chest, 07/31/2021 FINDINGS:  Cardiovascular: Satisfactory opacification of the pulmonary arteries to the segmental level. No evidence of pulmonary embolism. Normal heart size. Three-vessel coronary artery calcification. No pericardial effusion. Aortic atherosclerosis. Mediastinum/Nodes: No enlarged mediastinal, hilar, or axillary lymph nodes. Thyroid gland, trachea, and esophagus demonstrate no significant findings. Lungs/Pleura: Moderate centrilobular emphysema. Diffuse bilateral bronchial wall thickening and scattered bronchiolar plugging. Extensive, diffusely scattered centrilobular and tree-in-bud nodularity throughout the lungs. Occasional small, discrete pulmonary nodules, including a 0.5 cm nodule of the anterior medial segment right middle lobe (series 6, image 67) and a 0.5 cm nodule of the dependent superior segment right lower lobe (series 6, image 52). No pleural effusion or pneumothorax. Upper Abdomen: No acute abnormality. Musculoskeletal: No chest wall abnormality. No acute osseous findings. Review of the MIP images confirms the above findings. IMPRESSION: 1. Negative examination for pulmonary embolism. 2. Extensive, diffusely scattered centrilobular and tree-in-bud nodularity throughout the lungs, consistent with atypical infection, particularly atypical Mycobacterium. 3. Diffuse bilateral bronchial wall thickening and occasional bronchiolar mucous plugging, infectious or inflammatory. 4. Occasional small, discrete pulmonary nodules, measuring 0.5 cm or smaller, as seen on prior same day CT. 5. Emphysema. 6. Coronary artery disease. Aortic Atherosclerosis (ICD10-I70.0) and Emphysema (ICD10-J43.9). Electronically Signed   By: ADelanna AhmadiM.D.   On: 07/31/2021 13:10  ECHOCARDIOGRAM COMPLETE  Result Date: 08/02/2021    ECHOCARDIOGRAM REPORT   Patient Name:   YANCEY PEDLEY Date of Exam: 08/02/2021 Medical Rec #:  [id number]          Height:       69.0 in Accession #:    [id number]         Weight:       165.0 lb Date of  Birth:  [birth year]          BSA:          1.904 m Patient Age:    56 years           BP:           128/71 mmHg Patient Gender: M                  HR:           70 bpm. Exam Location:  [facility] Procedure: 2D Echo, Cardiac Doppler and Color Doppler Indications:     NSTEMI I21.4  History:         Patient has no prior history of Echocardiogram examinations.                  COPD; Risk Factors:Diabetes and Hypertension.  Sonographer:     Sherrie Sport Referring Phys:  [id number] Kathrin Ruddy ORGEL Diagnosing Phys: Isaias Cowman MD  Sonographer Comments: No parasternal window and no apical window. Image acquisition challenging due to COPD and The only obtainable view was subcostal. IMPRESSIONS  1. Left ventricular ejection fraction, by estimation, is 60 to 65%. The left ventricle has normal function. The left ventricle has no regional wall motion abnormalities. Left ventricular diastolic parameters are indeterminate.  2. Right ventricular systolic function is normal. The right ventricular size is normal.  3. The mitral valve is normal in structure. Mild mitral valve regurgitation. No evidence of mitral stenosis.  4. The aortic valve is normal in structure. Aortic valve regurgitation is not visualized. No aortic stenosis is present.  5. The inferior vena cava is normal in size with greater than 50% respiratory variability, suggesting right atrial pressure of 3 mmHg. FINDINGS  Left Ventricle: Left ventricular ejection fraction, by estimation, is 60 to 65%. The left ventricle has normal function. The left ventricle has no regional wall motion abnormalities. The left ventricular internal cavity size was normal in size. There is  no left ventricular hypertrophy. Left ventricular diastolic parameters are indeterminate. Right Ventricle: The right ventricular size is normal. No increase in right ventricular wall thickness. Right ventricular systolic function is normal. Left Atrium: Left atrial size was normal in size. Right Atrium:  Right atrial size was normal in size. Pericardium: There is no evidence of pericardial effusion. Mitral Valve: The mitral valve is normal in structure. Mild mitral valve regurgitation. No evidence of mitral valve stenosis. Tricuspid Valve: The tricuspid valve is normal in structure. Tricuspid valve regurgitation is mild . No evidence of tricuspid stenosis. Aortic Valve: The aortic valve is normal in structure. Aortic valve regurgitation is not visualized. No aortic stenosis is present. Pulmonic Valve: The pulmonic valve was normal in structure. Pulmonic valve regurgitation is not visualized. No evidence of pulmonic stenosis. Aorta: The aortic root is normal in size and structure. Venous: The inferior vena cava is normal in size with greater than 50% respiratory variability, suggesting right atrial pressure of 3 mmHg. IAS/Shunts: No atrial level shunt detected by color flow Doppler.  LEFT VENTRICLE PLAX 2D LVIDd:  3.20 cm LVIDs:         2.20 cm LV PW:         1.40 cm LV IVS:        1.10 cm  LEFT ATRIUM         Index LA diam:    4.00 cm 2.10 cm/m  PULMONIC VALVE PV Vmax:        1.19 m/s PV Vmean:       84.000 cm/s PV VTI:         0.197 m PV Peak grad:   5.7 mmHg PV Mean grad:   3.0 mmHg RVOT Peak grad: 5 mmHg   SHUNTS Pulmonic VTI: 0.202 m Isaias Cowman MD Electronically signed by Isaias Cowman MD Signature Date/Time: 08/02/2021/1:35:49 PM    Final     Microbiology: Results for orders placed or performed during the hospital encounter of 07/31/21  Resp Panel by RT-PCR (Flu A&B, Covid) Nasopharyngeal Swab     Status: None   Collection Time: 07/31/21  6:09 AM   Specimen: Nasopharyngeal Swab; Nasopharyngeal(NP) swabs in vial transport medium  Result Value Ref Range Status   SARS Coronavirus 2 by RT PCR NEGATIVE NEGATIVE Final    Comment: (NOTE) SARS-CoV-2 target nucleic acids are NOT DETECTED.  The SARS-CoV-2 RNA is generally detectable in upper respiratory specimens during the acute phase  of infection. The lowest concentration of SARS-CoV-2 viral copies this assay can detect is 138 copies/mL. A negative result does not preclude SARS-Cov-2 infection and should not be used as the sole basis for treatment or other patient management decisions. A negative result may occur with  improper specimen collection/handling, submission of specimen other than nasopharyngeal swab, presence of viral mutation(s) within the areas targeted by this assay, and inadequate number of viral copies(<138 copies/mL). A negative result must be combined with clinical observations, patient history, and epidemiological information. The expected result is Negative.  Fact Sheet for Patients:  EntrepreneurPulse.com.au  Fact Sheet for Healthcare Providers:  IncredibleEmployment.be  This test is no t yet approved or cleared by the [place] FDA and  has been authorized for detection and/or diagnosis of SARS-CoV-2 by FDA under an Emergency Use Authorization (EUA). This EUA will remain  in effect (meaning this test can be used) for the duration of the COVID-19 declaration under Section 564(b)(1) of the Act, 21 U.S.C.section 360bbb-3(b)(1), unless the authorization is terminated  or revoked sooner.       Influenza A by PCR NEGATIVE NEGATIVE Final   Influenza B by PCR NEGATIVE NEGATIVE Final    Comment: (NOTE) The Xpert Xpress SARS-CoV-2/FLU/RSV plus assay is intended as an aid in the diagnosis of influenza from Nasopharyngeal swab specimens and should not be used as a sole basis for treatment. Nasal washings and aspirates are unacceptable for Xpert Xpress SARS-CoV-2/FLU/RSV testing.  Fact Sheet for Patients: EntrepreneurPulse.com.au  Fact Sheet for Healthcare Providers: IncredibleEmployment.be  This test is not yet approved or cleared by the [place] FDA and has been authorized for detection and/or diagnosis of  SARS-CoV-2 by FDA under an Emergency Use Authorization (EUA). This EUA will remain in effect (meaning this test can be used) for the duration of the COVID-19 declaration under Section 564(b)(1) of the Act, 21 U.S.C. section 360bbb-3(b)(1), unless the authorization is terminated or revoked.  Performed at [facility], [address] [place], [place] [address]   Blood Culture (routine x 2)     Status: None (Preliminary result)   Collection Time: 07/31/21  7:12  AM   Specimen: BLOOD  Result Value Ref Range Status   Specimen Description BLOOD BLOOD LEFT FOREARM  Final   Special Requests   Final    BOTTLES DRAWN AEROBIC AND ANAEROBIC Blood Culture adequate volume   Culture   Final    NO GROWTH 4 DAYS Performed at [facility], [address] [place], [place] [address]    Report Status PENDING  Incomplete  Blood Culture (routine x 2)     Status: None (Preliminary result)   Collection Time: 07/31/21  7:17 AM   Specimen: BLOOD  Result Value Ref Range Status   Specimen Description BLOOD BLOOD LEFT HAND  Final   Special Requests   Final    BOTTLES DRAWN AEROBIC AND ANAEROBIC Blood Culture adequate volume   Culture   Final    NO GROWTH 4 DAYS Performed at [facility], [address], [place], [place] [address]    Report Status PENDING  Incomplete  Expectorated Sputum Assessment w Gram Stain, Rflx to Resp Cult     Status: None   Collection Time: 08/01/21  8:24 AM   Specimen: Expectorated Sputum  Result Value Ref Range Status   Specimen Description EXPECTORATED SPUTUM  Final   Special Requests NONE  Final   Sputum evaluation   Final    THIS SPECIMEN IS ACCEPTABLE FOR SPUTUM CULTURE Performed at [facility], [address] [place], [place] [address]    Report Status 08/01/2021 FINAL  Final  Culture, Respiratory w Gram Stain     Status: None   Collection Time: 08/01/21  8:24 AM  Result Value Ref Range Status   Specimen Description   Final     EXPECTORATED SPUTUM Performed at [facility], [address] [place], [place] [address]    Special Requests   Final    NONE Reflexed from [phone] Performed at [facility], [address] [place], [place] [address]    Gram Stain   Final    FEW WBC PRESENT,BOTH PMN AND MONONUCLEAR FEW SQUAMOUS EPITHELIAL CELLS PRESENT FEW GRAM POSITIVE COCCI FEW GRAM VARIABLE ROD RARE YEAST    Culture   Final    MODERATE Normal respiratory flora-no Staph aureus or Pseudomonas seen Performed at [facility], [place] [address] [place], [place] [address]    Report Status 08/03/2021 FINAL  Final    Labs: CBC: Recent Labs  Lab 07/31/21 0600 08/01/21 0519 08/04/21 0441  WBC 14.9* 16.8* 24.1*  HGB 13.5 11.9* 13.7  HCT 41.6 36.1* 41.5  MCV 92.9 91.2 91.2  PLT 268 267 001   Basic Metabolic Panel: Recent Labs  Lab 07/31/21 0600 08/01/21 0519 08/02/21 0350 08/04/21 0441  NA 138 139 140 141  K 3.8 4.0 4.6 4.4  CL 105 106 105 103  CO2 _0 33*  GLUCOSE 194* 171* 117* 102*  BUN 15 18 29* 20  CREATININE 0.73 0.73 0.83 0.60*  CALCIUM 9.1 9.1 8.9 8.7*   Liver Function Tests: Recent Labs  Lab 08/01/21 0519  AST 25  ALT 29  ALKPHOS 69  BILITOT 0.4  PROT 6.8  ALBUMIN 3.2*   CBG: Recent Labs  Lab 08/03/21 0806 08/03/21 1143 08/03/21 1621 08/03/21 2226 08/04/21 0749  GLUCAP 101* 207* 195* 127* 105*    Discharge time spent: greater than 30 minutes.  Signed: Fritzi Mandes, MD [facility] 08/04/2021

## 2021-08-04 NOTE — Progress Notes (Signed)
PIV's removed. Discharge instructions completed. Patient verbalized understanding of medication regimen, follow up appointments and discharge instructions. Patient belongings gathered and packed to discharge. Sister on the way to pick patient up.

## 2021-08-04 NOTE — Assessment & Plan Note (Signed)
-  Advised smoking cessation

## 2021-08-04 NOTE — Assessment & Plan Note (Signed)
Continue home lisinopril blood pressure stable

## 2021-08-04 NOTE — Progress Notes (Signed)
SATURATION QUALIFICATIONS: (This note is used to comply with regulatory documentation for home oxygen)  Patient Saturations on Room Air at Rest = 89%  Patient Saturations on Room Air while Ambulating = 87%  Patient Saturations on 2 Liters of oxygen while Ambulating = 91%  Please briefly explain why patient needs home oxygen:

## 2021-08-05 LAB — CULTURE, BLOOD (ROUTINE X 2)
Culture: NO GROWTH
Culture: NO GROWTH
Special Requests: ADEQUATE
Special Requests: ADEQUATE

## 2021-08-20 ENCOUNTER — Other Ambulatory Visit: Payer: Self-pay | Admitting: Specialist

## 2021-08-20 DIAGNOSIS — R0609 Other forms of dyspnea: Secondary | ICD-10-CM

## 2021-08-20 DIAGNOSIS — F17200 Nicotine dependence, unspecified, uncomplicated: Secondary | ICD-10-CM

## 2021-08-20 DIAGNOSIS — J439 Emphysema, unspecified: Secondary | ICD-10-CM

## 2022-02-16 ENCOUNTER — Ambulatory Visit: Admission: RE | Admit: 2022-02-16 | Payer: Medicare Other | Source: Ambulatory Visit

## 2022-06-13 ENCOUNTER — Other Ambulatory Visit: Payer: Self-pay | Admitting: Specialist

## 2022-06-13 DIAGNOSIS — R918 Other nonspecific abnormal finding of lung field: Secondary | ICD-10-CM

## 2022-06-13 DIAGNOSIS — J449 Chronic obstructive pulmonary disease, unspecified: Secondary | ICD-10-CM

## 2022-06-22 ENCOUNTER — Ambulatory Visit: Payer: Medicare Other

## 2022-06-28 ENCOUNTER — Ambulatory Visit
Admission: RE | Admit: 2022-06-28 | Discharge: 2022-06-28 | Disposition: A | Discharge status: Home / Self Care | Payer: Medicare Other | Source: Ambulatory Visit | Attending: Specialist | Admitting: Specialist

## 2022-06-28 DIAGNOSIS — J449 Chronic obstructive pulmonary disease, unspecified: Secondary | ICD-10-CM | POA: Diagnosis present

## 2022-06-28 DIAGNOSIS — R918 Other nonspecific abnormal finding of lung field: Secondary | ICD-10-CM | POA: Insufficient documentation

## 2022-07-07 IMAGING — CT CT CHEST W/O CM
1 series · 15 of 34 positions shown, 19 images · non-contrast
Comparison: None.

CLINICAL DATA: Cough, congestion, shortness of breath 4 days,
smoker

EXAM:
CT CHEST WITHOUT CONTRAST
TECHNIQUE: Multidetector CT imaging of the chest was performed following the
standard protocol without IV contrast.

[Series 2: thorax · axial · 0.75mm/px · z∈[-559,-285]mm · 15 of 161 slices shown, 19 images]
[im 12/161  mediastinal]
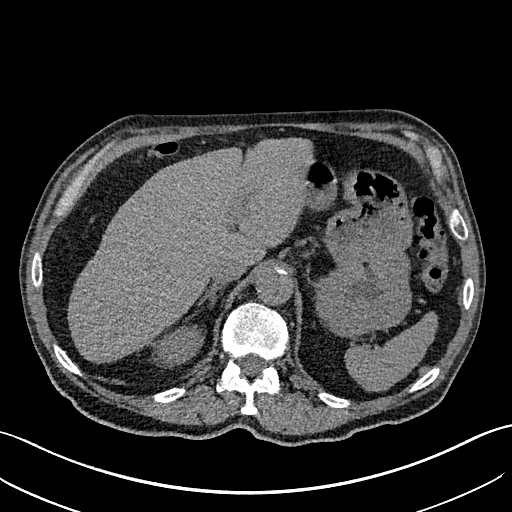
[im 12/161  lung]
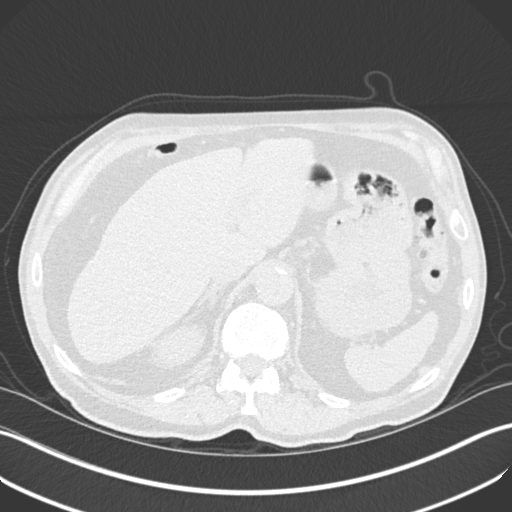
[im 24/161  lung]
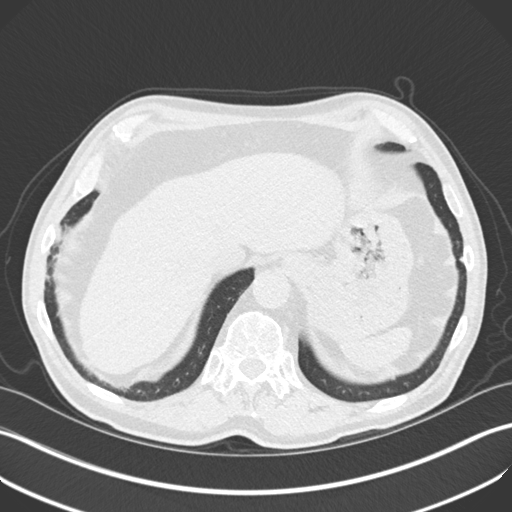
[im 33/161  lung]
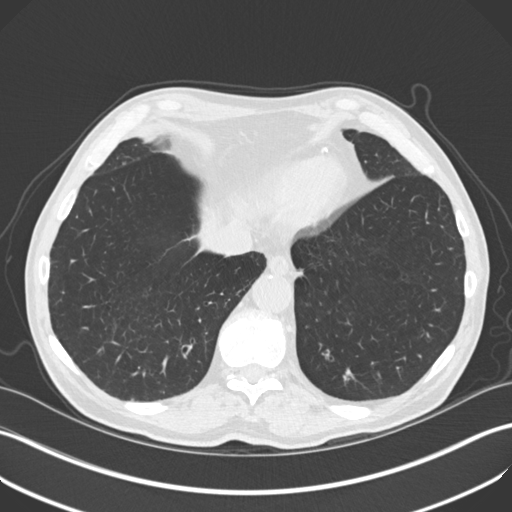
[im 42/161  lung]
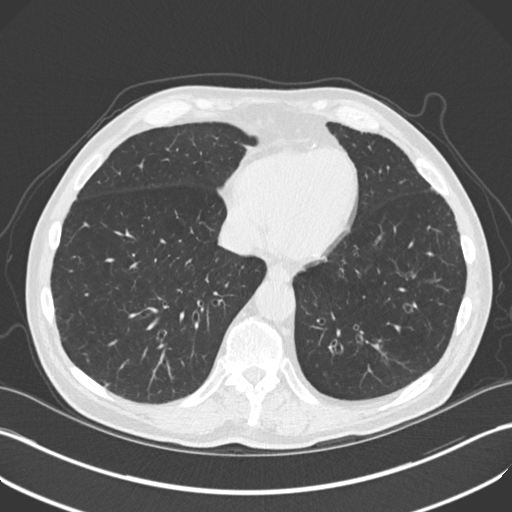
[im 54/161  mediastinal]
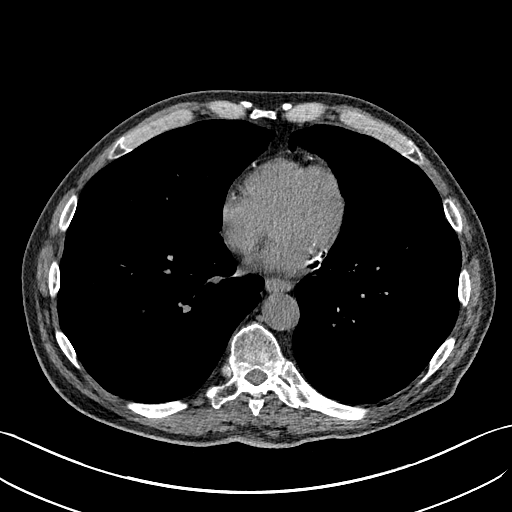
[im 54/161  lung]
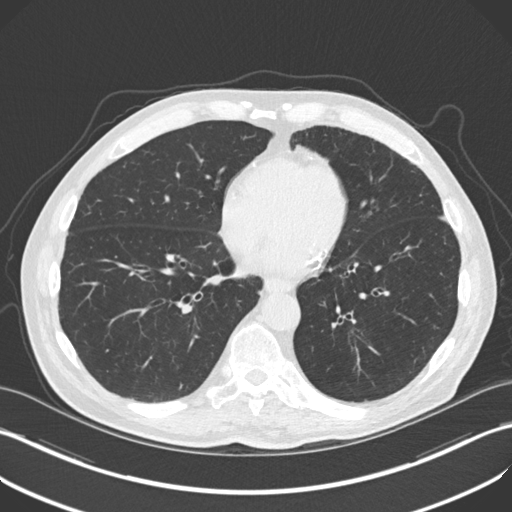
[im 65/161  lung]
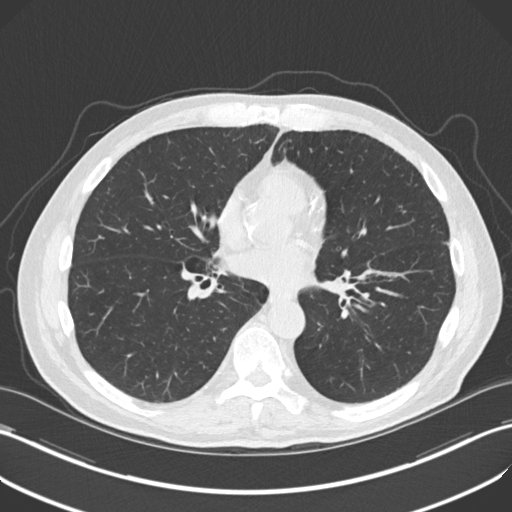
[im 72/161  lung]
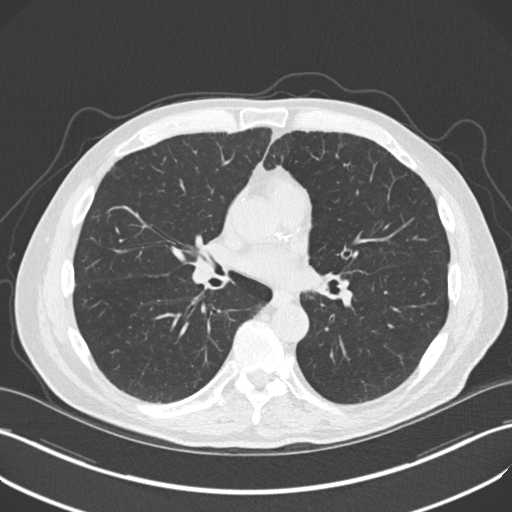
[im 83/161  lung]
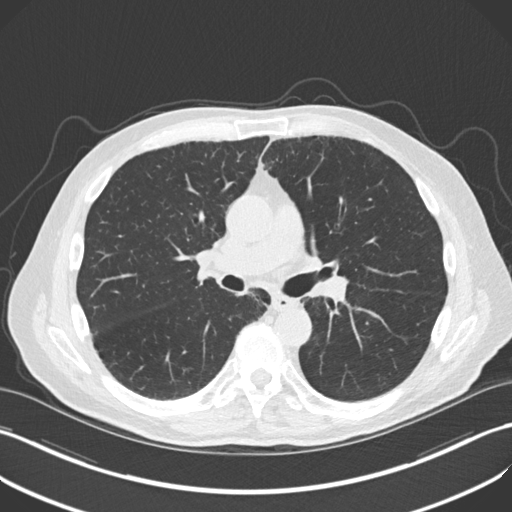
[im 89/161  mediastinal]
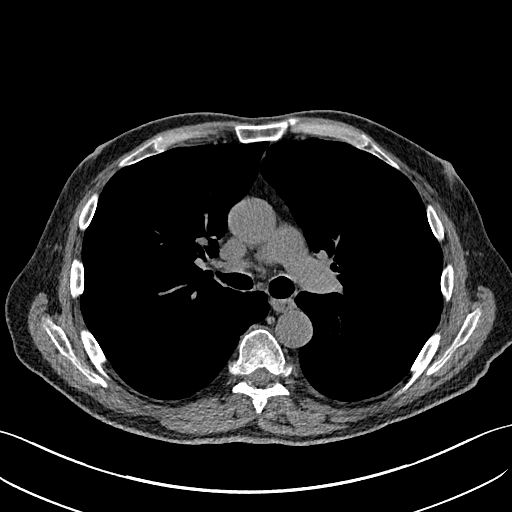
[im 89/161  lung]
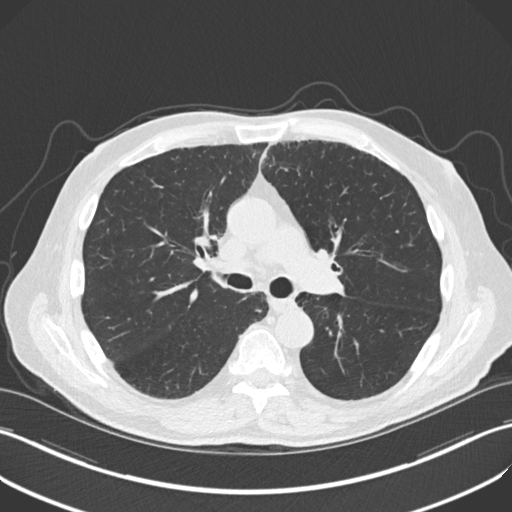
[im 97/161  lung]
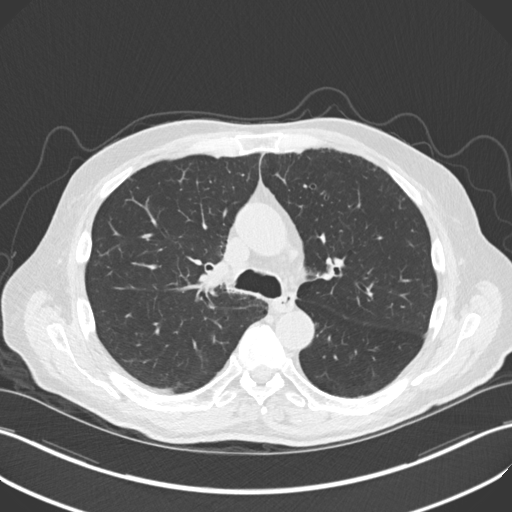
[im 107/161  lung]
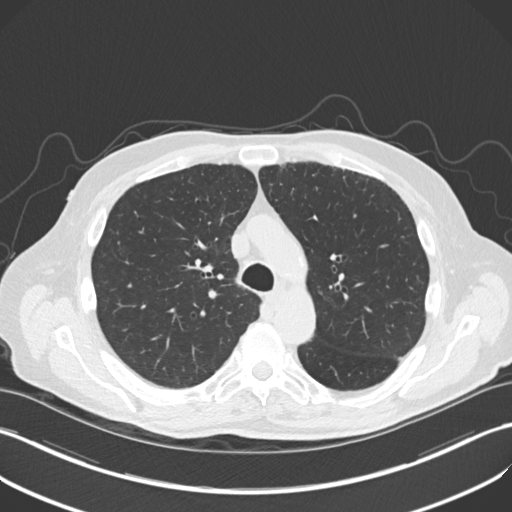
[im 119/161  lung]
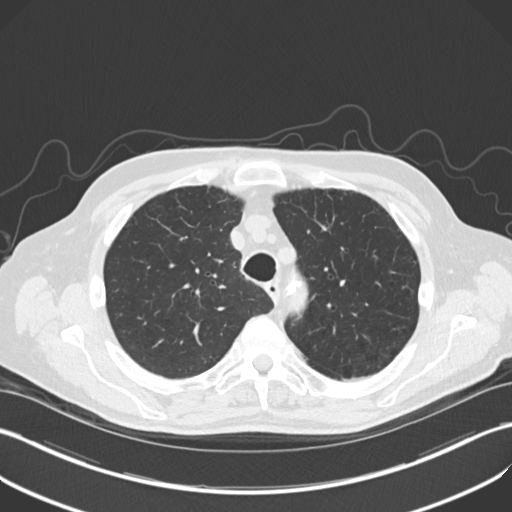
[im 129/161  mediastinal]
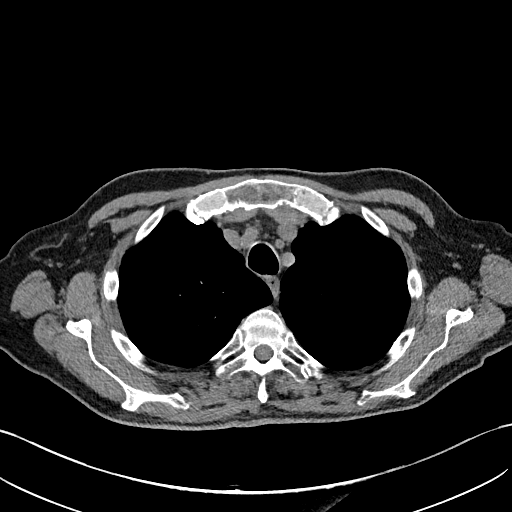
[im 129/161  lung]
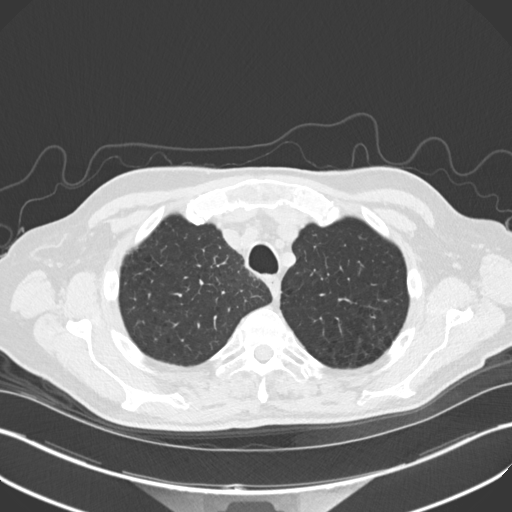
[im 137/161  lung]
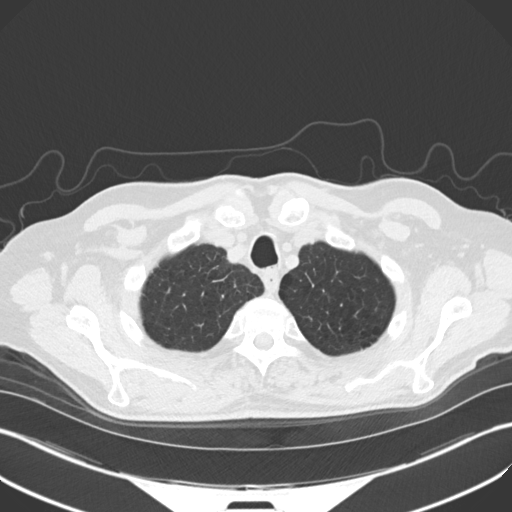
[im 149/161  lung]
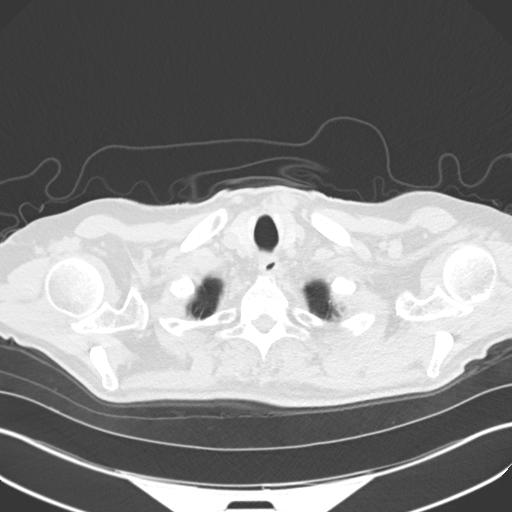

[15 of 34 positions shown; findings below may reference images not displayed]

FINDINGS: Cardiovascular: Aortic atherosclerosis. Normal heart size. Extensive
three-vessel coronary artery calcifications and/or stents. No
pericardial effusion.

Mediastinum/Nodes: No enlarged mediastinal, hilar, or axillary lymph
nodes. Thyroid gland, trachea, and esophagus demonstrate no
significant findings.

Lungs/Pleura: Moderate centrilobular and paraseptal emphysema.
Diffuse bilateral bronchial wall thickening. 0.5 cm nodule of the
anterior to right middle lobe (series 3, image 99). 0.4 cm nodule of
lingula (series 3, image 102). Definitively benign fissural nodule
of the superior segment left lower lobe (series 3, image 81). No
pleural effusion or pneumothorax.

Upper Abdomen: No acute abnormality. Coarse contour of the partially
included liver.

Musculoskeletal: No chest wall mass or suspicious bone lesions
identified.
IMPRESSION: 1. Moderate emphysema and diffuse bilateral bronchial wall
thickening.
2. No acute airspace disease.
3. Small pulmonary nodules of the right middle lobe and lingula
measuring up to 0.5 cm. No follow-up needed if patient is low-risk
(and has no known or suspected primary neoplasm). Non-contrast chest
CT can be considered in 12 months if patient is high-risk. This
recommendation follows the consensus statement: Guidelines for
Management of Incidental Pulmonary Nodules Detected on CT Images:
4. Coronary artery disease.
5. Coarse contour of the partially included liver, suggestive of
cirrhosis. Correlate with biochemical findings.

Aortic Atherosclerosis (UXPNL-WRG.G) and Emphysema (UXPNL-2U1.0).

## 2023-06-30 ENCOUNTER — Other Ambulatory Visit: Payer: Self-pay | Admitting: Specialist

## 2023-06-30 DIAGNOSIS — F17210 Nicotine dependence, cigarettes, uncomplicated: Secondary | ICD-10-CM

## 2023-06-30 DIAGNOSIS — J449 Chronic obstructive pulmonary disease, unspecified: Secondary | ICD-10-CM

## 2023-07-19 ENCOUNTER — Ambulatory Visit: Payer: Medicare Other

## 2023-07-21 ENCOUNTER — Ambulatory Visit: Payer: Medicare Other
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
## 2023-10-25 ENCOUNTER — Other Ambulatory Visit: Payer: Self-pay | Admitting: Specialist

## 2023-10-25 DIAGNOSIS — R918 Other nonspecific abnormal finding of lung field: Secondary | ICD-10-CM

## 2023-10-25 DIAGNOSIS — Z87891 Personal history of nicotine dependence: Secondary | ICD-10-CM
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
## 2023-12-08 ENCOUNTER — Other Ambulatory Visit: Payer: Self-pay | Admitting: Specialist

## 2023-12-08 DIAGNOSIS — Z87891 Personal history of nicotine dependence: Secondary | ICD-10-CM

## 2023-12-08 DIAGNOSIS — R918 Other nonspecific abnormal finding of lung field: Secondary | ICD-10-CM

## 2023-12-14 ENCOUNTER — Ambulatory Visit
Admission: RE | Admit: 2023-12-14 | Discharge: 2023-12-14 | Disposition: A | Discharge status: Home / Self Care | Source: Ambulatory Visit | Attending: Specialist | Admitting: Specialist

## 2023-12-14 DIAGNOSIS — Z87891 Personal history of nicotine dependence: Secondary | ICD-10-CM | POA: Diagnosis present

## 2023-12-14 DIAGNOSIS — R918 Other nonspecific abnormal finding of lung field: Secondary | ICD-10-CM | POA: Diagnosis present

## 2024-03-21 ENCOUNTER — Emergency Department

## 2024-03-21 ENCOUNTER — Inpatient Hospital Stay
Admission: EM | Admit: 2024-03-21 | Discharge: 2024-03-23 | DRG: 871 | Disposition: A | Discharge status: Home / Self Care | Attending: Internal Medicine | Admitting: Internal Medicine

## 2024-03-21 DIAGNOSIS — J440 Chronic obstructive pulmonary disease with acute lower respiratory infection: Secondary | ICD-10-CM | POA: Diagnosis present

## 2024-03-21 DIAGNOSIS — I2489 Other forms of acute ischemic heart disease: Secondary | ICD-10-CM | POA: Diagnosis present

## 2024-03-21 DIAGNOSIS — Z7982 Long term (current) use of aspirin: Secondary | ICD-10-CM

## 2024-03-21 DIAGNOSIS — E119 Type 2 diabetes mellitus without complications: Secondary | ICD-10-CM | POA: Diagnosis present

## 2024-03-21 DIAGNOSIS — E872 Acidosis, unspecified: Secondary | ICD-10-CM | POA: Diagnosis present

## 2024-03-21 DIAGNOSIS — E785 Hyperlipidemia, unspecified: Secondary | ICD-10-CM | POA: Diagnosis present

## 2024-03-21 DIAGNOSIS — J189 Pneumonia, unspecified organism: Secondary | ICD-10-CM | POA: Diagnosis present

## 2024-03-21 DIAGNOSIS — F17210 Nicotine dependence, cigarettes, uncomplicated: Secondary | ICD-10-CM | POA: Diagnosis present

## 2024-03-21 DIAGNOSIS — Z7984 Long term (current) use of oral hypoglycemic drugs: Secondary | ICD-10-CM | POA: Diagnosis not present

## 2024-03-21 DIAGNOSIS — J441 Chronic obstructive pulmonary disease with (acute) exacerbation: Secondary | ICD-10-CM

## 2024-03-21 DIAGNOSIS — J9601 Acute respiratory failure with hypoxia: Secondary | ICD-10-CM | POA: Diagnosis not present

## 2024-03-21 DIAGNOSIS — J9622 Acute and chronic respiratory failure with hypercapnia: Secondary | ICD-10-CM | POA: Diagnosis present

## 2024-03-21 DIAGNOSIS — J439 Emphysema, unspecified: Secondary | ICD-10-CM | POA: Diagnosis present

## 2024-03-21 DIAGNOSIS — A419 Sepsis, unspecified organism: Principal | ICD-10-CM | POA: Diagnosis present

## 2024-03-21 DIAGNOSIS — Z87820 Personal history of traumatic brain injury: Secondary | ICD-10-CM | POA: Diagnosis not present

## 2024-03-21 DIAGNOSIS — G9341 Metabolic encephalopathy: Secondary | ICD-10-CM | POA: Diagnosis present

## 2024-03-21 DIAGNOSIS — J9602 Acute respiratory failure with hypercapnia: Secondary | ICD-10-CM | POA: Diagnosis not present

## 2024-03-21 DIAGNOSIS — Z7951 Long term (current) use of inhaled steroids: Secondary | ICD-10-CM | POA: Diagnosis not present

## 2024-03-21 DIAGNOSIS — Z9981 Dependence on supplemental oxygen: Secondary | ICD-10-CM

## 2024-03-21 DIAGNOSIS — J9600 Acute respiratory failure, unspecified whether with hypoxia or hypercapnia: Secondary | ICD-10-CM | POA: Diagnosis present

## 2024-03-21 DIAGNOSIS — J9621 Acute and chronic respiratory failure with hypoxia: Secondary | ICD-10-CM | POA: Diagnosis present

## 2024-03-21 DIAGNOSIS — Z79899 Other long term (current) drug therapy: Secondary | ICD-10-CM | POA: Diagnosis not present

## 2024-03-21 DIAGNOSIS — I10 Essential (primary) hypertension: Secondary | ICD-10-CM | POA: Diagnosis present

## 2024-03-21 DIAGNOSIS — Z1152 Encounter for screening for COVID-19: Secondary | ICD-10-CM | POA: Diagnosis not present

## 2024-03-21 DIAGNOSIS — R296 Repeated falls: Secondary | ICD-10-CM | POA: Diagnosis present

## 2024-03-21 DIAGNOSIS — R6520 Severe sepsis without septic shock: Secondary | ICD-10-CM | POA: Diagnosis present

## 2024-03-21 DIAGNOSIS — R531 Weakness: Secondary | ICD-10-CM

## 2024-03-21 LAB — BLOOD GAS, ARTERIAL
Acid-Base Excess: 10 mmol/L — ABNORMAL HIGH (ref 0.0–2.0)
Bicarbonate: 37.2 mmol/L — ABNORMAL HIGH (ref 20.0–28.0)
FIO2: 28 %
O2 Content: 2 L/min
O2 Saturation: 96.7 %
Patient temperature: 37
pCO2 arterial: 60 mmHg — ABNORMAL HIGH (ref 32–48)
pH, Arterial: 7.4 (ref 7.35–7.45)
pO2, Arterial: 65 mmHg — ABNORMAL LOW (ref 83–108)

## 2024-03-21 LAB — BLOOD GAS, VENOUS
Acid-Base Excess: 7.3 mmol/L — ABNORMAL HIGH (ref 0.0–2.0)
Acid-Base Excess: 2.2 mmol/L — ABNORMAL HIGH (ref 0.0–2.0)
Acid-Base Excess: 2.1 mmol/L — ABNORMAL HIGH (ref 0.0–2.0)
Acid-Base Excess: 0.1 mmol/L (ref 0.0–2.0)
Bicarbonate: 40.3 mmol/L — ABNORMAL HIGH (ref 20.0–28.0)
Bicarbonate: 36.8 mmol/L — ABNORMAL HIGH (ref 20.0–28.0)
Bicarbonate: 35.3 mmol/L — ABNORMAL HIGH (ref 20.0–28.0)
Bicarbonate: 32 mmol/L — ABNORMAL HIGH (ref 20.0–28.0)
Delivery systems: POSITIVE
Delivery systems: POSITIVE
FIO2: 100 %
FIO2: 70 %
O2 Saturation: 98.4 %
O2 Saturation: 89.8 %
O2 Saturation: 73.5 %
O2 Saturation: 86.7 %
PEEP: 6 cmH2O
Patient temperature: 37
Patient temperature: 37
Patient temperature: 37
Patient temperature: 37
Pressure support: 8 cmH2O
pCO2, Ven: 113 mmHg (ref 44–60)
pCO2, Ven: >123 mmHg (ref 44–60)
pCO2, Ven: 111 mmHg (ref 44–60)
pCO2, Ven: 94 mmHg (ref 44–60)
pH, Ven: 7.16 — CL (ref 7.25–7.43)
pH, Ven: 7.06 — CL (ref 7.25–7.43)
pH, Ven: 7.11 — CL (ref 7.25–7.43)
pH, Ven: 7.14 — CL (ref 7.25–7.43)
pO2, Ven: 106 mmHg — ABNORMAL HIGH (ref 32–45)
pO2, Ven: 65 mmHg — ABNORMAL HIGH (ref 32–45)
pO2, Ven: 45 mmHg (ref 32–45)
pO2, Ven: 56 mmHg — ABNORMAL HIGH (ref 32–45)

## 2024-03-21 LAB — RESPIRATORY PANEL BY PCR

## 2024-03-21 LAB — CBC WITH DIFFERENTIAL/PLATELET
Abs Immature Granulocytes: 0.13 K/uL — ABNORMAL HIGH (ref 0.00–0.07)
Basophils Absolute: 0.1 K/uL (ref 0.0–0.1)
Basophils Relative: 0 %
Eosinophils Absolute: 0 K/uL (ref 0.0–0.5)
Eosinophils Relative: 0 %
HCT: 43.3 % (ref 39.0–52.0)
Hemoglobin: 13.4 g/dL (ref 13.0–17.0)
Immature Granulocytes: 1 %
Lymphocytes Relative: 14 %
Lymphs Abs: 2.6 K/uL (ref 0.7–4.0)
MCH: 30.6 pg (ref 26.0–34.0)
MCHC: 30.9 g/dL (ref 30.0–36.0)
MCV: 98.9 fL (ref 80.0–100.0)
Monocytes Absolute: 2.4 K/uL — ABNORMAL HIGH (ref 0.1–1.0)
Monocytes Relative: 13 %
Neutro Abs: 12.8 K/uL — ABNORMAL HIGH (ref 1.7–7.7)
Neutrophils Relative %: 72 %
Platelets: 271 K/uL (ref 150–400)
RBC: 4.38 MIL/uL (ref 4.22–5.81)
RDW: 14.9 % (ref 11.5–15.5)
WBC: 17.9 K/uL — ABNORMAL HIGH (ref 4.0–10.5)
nRBC: 0 % (ref 0.0–0.2)

## 2024-03-21 LAB — COMPREHENSIVE METABOLIC PANEL WITH GFR
ALT: 28 U/L (ref 0–44)
AST: 33 U/L (ref 15–41)
Albumin: 4 g/dL (ref 3.5–5.0)
Alkaline Phosphatase: 89 U/L (ref 38–126)
Anion gap: 13 (ref 5–15)
BUN: 25 mg/dL — ABNORMAL HIGH (ref 8–23)
CO2: 34 mmol/L — ABNORMAL HIGH (ref 22–32)
Calcium: 9.2 mg/dL (ref 8.9–10.3)
Chloride: 98 mmol/L (ref 98–111)
Creatinine, Ser: 0.73 mg/dL (ref 0.61–1.24)
GFR, Estimated: >60 mL/min (ref >60)
Glucose, Bld: 152 mg/dL — ABNORMAL HIGH (ref 70–99)
Potassium: 4.6 mmol/L (ref 3.5–5.1)
Sodium: 145 mmol/L (ref 135–145)
Total Bilirubin: 0.4 mg/dL (ref 0.0–1.2)
Total Protein: 7.5 g/dL (ref 6.5–8.1)

## 2024-03-21 LAB — TROPONIN I (HIGH SENSITIVITY)
Troponin I (High Sensitivity): 611 ng/L (ref <18)
Troponin I (High Sensitivity): 435 ng/L (ref <18)

## 2024-03-21 LAB — LACTIC ACID, PLASMA
Lactic Acid, Venous: 0.7 mmol/L (ref 0.5–1.9)
Lactic Acid, Venous: 2.6 mmol/L (ref 0.5–1.9)
Lactic Acid, Venous: 5.1 mmol/L (ref 0.5–1.9)

## 2024-03-21 LAB — RESP PANEL BY RT-PCR (RSV, FLU A&B, COVID)  RVPGX2
Influenza A by PCR: NEGATIVE
Influenza B by PCR: NEGATIVE
Resp Syncytial Virus by PCR: NEGATIVE
SARS Coronavirus 2 by RT PCR: NEGATIVE

## 2024-03-21 LAB — BRAIN NATRIURETIC PEPTIDE: B Natriuretic Peptide: 431.3 pg/mL — ABNORMAL HIGH (ref 0.0–100.0)

## 2024-03-21 LAB — PROCALCITONIN: Procalcitonin: <0.1 ng/mL

## 2024-03-21 LAB — GLUCOSE, CAPILLARY
Glucose-Capillary: 134 mg/dL — ABNORMAL HIGH (ref 70–99)
Glucose-Capillary: 109 mg/dL — ABNORMAL HIGH (ref 70–99)

## 2024-03-21 LAB — MRSA NEXT GEN BY PCR, NASAL: MRSA by PCR Next Gen: NOT DETECTED

## 2024-03-21 MED ORDER — ALBUTEROL (5 MG/ML) CONTINUOUS INHALATION SOLN
10.0000 mg/h | INHALATION_SOLUTION | Freq: Once | RESPIRATORY_TRACT | Status: AC
  Administered 2024-03-21: 10 mg/h via RESPIRATORY_TRACT
  Filled 2024-03-21: qty 20

## 2024-03-21 MED ORDER — SODIUM CHLORIDE 0.9 % IV BOLUS
500.0000 mL | Freq: Once | INTRAVENOUS | Status: AC
Start: 2024-03-21 — End: 2024-03-21
  Administered 2024-03-21: 500 mL via INTRAVENOUS

## 2024-03-21 MED ORDER — CHLORHEXIDINE GLUCONATE CLOTH 2 % EX PADS
6.0000 | MEDICATED_PAD | Freq: Every day | CUTANEOUS | Status: DC
Start: 2024-03-21 — End: 2024-03-23
  Administered 2024-03-21 – 2024-03-23 (×3): 6 via TOPICAL
  Filled 2024-03-21: qty 6

## 2024-03-21 MED ORDER — LACTATED RINGERS IV BOLUS
500.0000 mL | Freq: Once | INTRAVENOUS | Status: AC
  Administered 2024-03-21: 500 mL via INTRAVENOUS

## 2024-03-21 MED ORDER — ASPIRIN 81 MG PO TBEC
81.0000 mg | DELAYED_RELEASE_TABLET | Freq: Every day | ORAL | Status: DC
  Administered 2024-03-22 – 2024-03-23 (×2): 81 mg via ORAL
  Filled 2024-03-21 (×3): qty 1

## 2024-03-21 MED ORDER — ORAL CARE MOUTH RINSE: 15.0000 mL | OROMUCOSAL | Status: DC | PRN

## 2024-03-21 MED ORDER — SODIUM CHLORIDE 0.9 % IV SOLN
1.0000 g | Freq: Once | INTRAVENOUS | Status: AC
  Administered 2024-03-21: 1 g via INTRAVENOUS
  Filled 2024-03-21: qty 10

## 2024-03-21 MED ORDER — ONDANSETRON HCL 4 MG PO TABS: 4.0000 mg | ORAL_TABLET | Freq: Four times a day (QID) | ORAL | Status: DC | PRN

## 2024-03-21 MED ORDER — SODIUM CHLORIDE 0.9 % IV SOLN
100.0000 mg | Freq: Once | INTRAVENOUS | Status: AC
  Administered 2024-03-21: 100 mg via INTRAVENOUS
  Filled 2024-03-21: qty 100

## 2024-03-21 MED ORDER — INSULIN ASPART 100 UNIT/ML IJ SOLN: 0.0000 [IU] | Freq: Every day | INTRAMUSCULAR | Status: DC

## 2024-03-21 MED ORDER — IPRATROPIUM-ALBUTEROL 0.5-2.5 (3) MG/3ML IN SOLN: 3.0000 mL | RESPIRATORY_TRACT | Status: DC | PRN

## 2024-03-21 MED ORDER — ONDANSETRON HCL 4 MG/2ML IJ SOLN: 4.0000 mg | Freq: Four times a day (QID) | INTRAMUSCULAR | Status: DC | PRN

## 2024-03-21 MED ORDER — BUDESONIDE 0.25 MG/2ML IN SUSP
0.2500 mg | Freq: Two times a day (BID) | RESPIRATORY_TRACT | Status: DC
  Administered 2024-03-21 – 2024-03-22 (×3): 0.25 mg via RESPIRATORY_TRACT
  Filled 2024-03-21 (×3): qty 2

## 2024-03-21 MED ORDER — ENOXAPARIN SODIUM 40 MG/0.4ML IJ SOSY
40.0000 mg | PREFILLED_SYRINGE | INTRAMUSCULAR | Status: DC
  Administered 2024-03-21 – 2024-03-23 (×3): 40 mg via SUBCUTANEOUS
  Filled 2024-03-21 (×3): qty 0.4

## 2024-03-21 MED ORDER — SENNOSIDES-DOCUSATE SODIUM 8.6-50 MG PO TABS: 1.0000 | ORAL_TABLET | Freq: Every evening | ORAL | Status: DC | PRN

## 2024-03-21 MED ORDER — ACETAMINOPHEN 650 MG RE SUPP: 650.0000 mg | Freq: Four times a day (QID) | RECTAL | Status: DC | PRN

## 2024-03-21 MED ORDER — ORAL CARE MOUTH RINSE
15.0000 mL | OROMUCOSAL | Status: DC
Start: 2024-03-21 — End: 2024-03-23
  Administered 2024-03-21 – 2024-03-22 (×5): 15 mL via OROMUCOSAL
  Filled 2024-03-21: qty 15

## 2024-03-21 MED ORDER — IPRATROPIUM-ALBUTEROL 0.5-2.5 (3) MG/3ML IN SOLN
3.0000 mL | Freq: Once | RESPIRATORY_TRACT | Status: AC
  Administered 2024-03-21: 3 mL via RESPIRATORY_TRACT
  Filled 2024-03-21 (×2): qty 3

## 2024-03-21 MED ORDER — METHYLPREDNISOLONE SODIUM SUCC 40 MG IJ SOLR
40.0000 mg | Freq: Two times a day (BID) | INTRAMUSCULAR | Status: DC
Start: 2024-03-21 — End: 2024-03-22
  Administered 2024-03-21 – 2024-03-22 (×3): 40 mg via INTRAVENOUS
  Filled 2024-03-21 (×3): qty 1

## 2024-03-21 MED ORDER — INSULIN ASPART 100 UNIT/ML IJ SOLN
0.0000 [IU] | Freq: Three times a day (TID) | INTRAMUSCULAR | Status: DC
  Administered 2024-03-22: 3 [IU] via SUBCUTANEOUS
  Administered 2024-03-22: 2 [IU] via SUBCUTANEOUS
  Administered 2024-03-23: 5 [IU] via SUBCUTANEOUS
  Filled 2024-03-21 (×3): qty 1

## 2024-03-21 MED ORDER — SODIUM CHLORIDE 0.9 % IV SOLN
2.0000 g | INTRAVENOUS | Status: DC
  Administered 2024-03-22: 2 g via INTRAVENOUS
  Filled 2024-03-21: qty 20

## 2024-03-21 MED ORDER — ACETAMINOPHEN 325 MG PO TABS
650.0000 mg | ORAL_TABLET | Freq: Four times a day (QID) | ORAL | Status: DC | PRN
  Administered 2024-03-21: 650 mg via ORAL
  Filled 2024-03-21: qty 2

## 2024-03-21 MED ORDER — IPRATROPIUM-ALBUTEROL 0.5-2.5 (3) MG/3ML IN SOLN
3.0000 mL | Freq: Four times a day (QID) | RESPIRATORY_TRACT | Status: DC
  Administered 2024-03-21 (×3): 3 mL via RESPIRATORY_TRACT
  Filled 2024-03-21 (×3): qty 3

## 2024-03-21 MED ORDER — SODIUM CHLORIDE 0.9 % IV SOLN
500.0000 mg | INTRAVENOUS | Status: DC
  Administered 2024-03-21 – 2024-03-22 (×2): 500 mg via INTRAVENOUS
  Filled 2024-03-21 (×2): qty 5

## 2024-03-21 NOTE — ED Triage Notes (Signed)
 Pt arrived via EMS from home for respiratory distress. Per EMS pt was 64% on room air. EMS gave 2 duo-ned and solumedrol. Pt is lethargic and hard to arouse.

## 2024-03-21 NOTE — Progress Notes (Signed)
 Patient has upper partial in bag in room. No other belongings noted.
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
## 2024-03-21 NOTE — ED Provider Notes (Signed)
 [facility] Provider Note    Event Date/Time   First MD Initiated Contact with Patient 03/21/24 0121     (approximate)   History   Respiratory Distress   HPI  Tyler [name] is a 73 y.o. male with history of COPD, diabetes, hypertension, presenting with shortness of breath.  Per independent history from EMS, patient had been refusing to come to the hospital but has had been having shortness of breath, tried his albuterol  at home without improvement.  When they got to him he was satting 60s on room air.  Was tachypneic, had increased work of breathing, they gave him 125 mg of Solu-Medrol , 2 DuoNebs, placed him on CPAP with some improvement to breathing.  He denies any chest pain, does note some chest tightness and shortness of breath.  Does note cough.  No reported fevers at home.  Independent history from EMS as above.  On independent chart review, he does have severe COPD on 3 L nasal cannula at baseline, is still smoker, had a prior echo that was done that showed an EF of 60 to 65%.     Physical Exam   Triage Vital Signs: ED Triage Vitals  Encounter Vitals Group     BP      Girls Systolic BP Percentile      Girls Diastolic BP Percentile      Boys Systolic BP Percentile      Boys Diastolic BP Percentile      Pulse      Resp      Temp      Temp src      SpO2      Weight      Height      Head Circumference      Peak Flow      Pain Score      Pain Loc      Pain Education      Exclude from Growth Chart     Most recent vital signs: Vitals:   03/21/24 0139 03/21/24 0400  BP:  (!) 142/84  Pulse:  95  Resp:  (!) 21  Temp: 97.6 F (36.4 C)   SpO2:  100%     General: Eyes are closed but wakes up easily to voice CV:  Good peripheral perfusion.  Resp:  Normal effort.  Tachypneic, rhonchorous breath sounds bilaterally Abd:  No distention.  Soft nontender Other:  Trace lower extremity edema to the bilateral ankles without  unilateral Tenderness   ED Results / Procedures / Treatments   Labs (all labs ordered are listed, but only abnormal results are displayed) Labs Reviewed  BRAIN NATRIURETIC PEPTIDE - Abnormal; Notable for the following components:      Result Value   B Natriuretic Peptide 431.3 (*)    All other components within normal limits  COMPREHENSIVE METABOLIC PANEL WITH GFR - Abnormal; Notable for the following components:   CO2 34 (*)    Glucose, Bld 152 (*)    BUN 25 (*)    All other components within normal limits  CBC WITH DIFFERENTIAL/PLATELET - Abnormal; Notable for the following components:   WBC 17.9 (*)    Neutro Abs 12.8 (*)    Monocytes Absolute 2.4 (*)    Abs Immature Granulocytes 0.13 (*)    All other components within normal limits  BLOOD GAS, VENOUS - Abnormal; Notable for the following components:   pH, Ven 7.16 (*)    pCO2, Ven 113 (*)  pO2, Ven 106 (*)    Bicarbonate 40.3 (*)    Acid-Base Excess 7.3 (*)    All other components within normal limits  LACTIC ACID, PLASMA - Abnormal; Notable for the following components:   Lactic Acid, Venous 2.6 (*)    All other components within normal limits  BLOOD GAS, VENOUS - Abnormal; Notable for the following components:   pH, Ven 7.06 (*)    pCO2, Ven >123 (*)    pO2, Ven 65 (*)    Bicarbonate 36.8 (*)    Acid-Base Excess 2.2 (*)    All other components within normal limits  BLOOD GAS, VENOUS - Abnormal; Notable for the following components:   pH, Ven 7.11 (*)    pCO2, Ven 111 (*)    Bicarbonate 35.3 (*)    Acid-Base Excess 2.1 (*)    All other components within normal limits  TROPONIN I (HIGH SENSITIVITY) - Abnormal; Notable for the following components:   Troponin I (High Sensitivity) 611 (*)    All other components within normal limits  TROPONIN I (HIGH SENSITIVITY) - Abnormal; Notable for the following components:   Troponin I (High Sensitivity) 435 (*)    All other components within normal limits  RESP PANEL BY  RT-PCR (RSV, FLU A&B, COVID)  RVPGX2  CULTURE, BLOOD (ROUTINE X 2)  CULTURE, BLOOD (ROUTINE X 2)  LACTIC ACID, PLASMA     EKG  EKG shows, sinus rhythm, rate 84, normal QS, normal QTc,, no obvious ischemic ST elevation, not significant compared to prior   RADIOLOGY On my independent interpretation, chest x-ray shows right sided opacities   PROCEDURES:  Critical Care performed: Yes, see critical care procedure note(s)  .Critical Care  Performed by: Waymond Lorelle Cummins, MD Authorized by: Waymond Lorelle Cummins, MD   Critical care provider statement:    Critical care time (minutes):  75   Critical care was necessary to treat or prevent imminent or life-threatening deterioration of the following conditions:  Respiratory failure and sepsis   Critical care was time spent personally by me on the following activities:  Development of treatment plan with patient or surrogate, discussions with consultants, evaluation of patient's response to treatment, examination of patient, ordering and review of laboratory studies, ordering and review of radiographic studies, ordering and performing treatments and interventions, pulse oximetry, re-evaluation of patient's condition and review of old charts    MEDICATIONS ORDERED IN ED: Medications  lactated ringers  bolus 500 mL (has no administration in time range)  ipratropium-albuterol  (DUONEB) 0.5-2.5 (3) MG/3ML nebulizer solution 3 mL (3 mLs Nebulization Given 03/21/24 0131)  albuterol  (PROVENTIL ,VENTOLIN ) solution continuous neb (10 mg/hr Nebulization Given 03/21/24 0202)  doxycycline  (VIBRAMYCIN ) 100 mg in sodium chloride  0.9 % 250 mL IVPB (0 mg Intravenous Stopped 03/21/24 0424)  cefTRIAXone  (ROCEPHIN ) 1 g in sodium chloride  0.9 % 100 mL IVPB (0 g Intravenous Stopped 03/21/24 0354)  sodium chloride  0.9 % bolus 500 mL (0 mLs Intravenous Stopped 03/21/24 0354)     IMPRESSION / MDM / ASSESSMENT AND PLAN / ED COURSE  I reviewed the triage vital signs and the nursing  notes.                              Differential diagnosis includes, but is not limited to, COPD exacerbation, viral illness, COVID, influenza, RSV, acute hypercarbia, pneumonia, atypical ACS, arrhythmia, CHF.  Will get labs, EKG, troponin, chest x-ray, will place him on BiPAP, will give  him 1 more DuoNeb and start him on continuous albuterol , will give him IV doxycycline  for COPD exacerbation.  He already received steroids by EMS.  He will need to be admitted for further management.  Patient's presentation is most consistent with acute presentation with potential threat to life or bodily function.  Independent interpretation of labs and imaging below.  Clinical course as below.  Given that VBG improved after BiPAP changes in patient's mental status had improved as well with the change, ICU evaluated the patient and deemed him stable for stepdown unit.  They will add their recommendations and their notes and follow along as a pulmonology consult.  Will contact hospitalist for admission.  He is admitted to hospitalist team.  The patient is on the cardiac monitor to evaluate for evidence of arrhythmia and/or significant heart rate changes.   Clinical Course as of 03/21/24 0604  Thu Mar 21, 2024  0218 DG Chest 1 View IMPRESSION: 1. Faint patchy/nodular opacities in the right upper lobe, suggesting mild infection/pneumonia  Will add on ceftriaxone  to the doxycycline . [TT]  0219 Blood gas, venous(!!) Respiratory acidosis which is consistent with his COPD exacerbation.  He is on BiPAP at this time, will repeat VBG in 1 to 2 hours. [TT]  0219 Troponin I (High Sensitivity)(!!): 611 He has no chest pain, suspect this might be demand related. [TT]  0247 Independent review of rest of labs, electrolytes not severely deranged, LFTs are normal, his troponin is elevated, BNP is also elevated, he does have leukocytosis.  Respiratory viral panel is negative. [TT]  0404 Blood gas, venous(!!) pH is getting  worse including pCO2, discussed with respiratory and we will adjust his BiPAP settings from 8 over 6 to 12 or 6, will reach out to ICU for admission.  When he was initially placed on BiPAP, he would occasionally open his eyes to voice, but would sometimes not open his eyes to sternal rub.  No gurgling, no snoring respirations, he is protecting his airway at this time. [TT]  0405 Troponin I (High Sensitivity)(!!): 435 Troponin is downtrending. [TT]  [id number] On reassessment after adjustments to his BiPAP, patient pulled off the mask from his face, is slightly more responsive and does open his eyes when we talk to him.  Sister is at bedside, says that she makes medical decisions for him when he is unable to do so, we did discuss that in 2023 he had a DNR order in but she states that he has not discussed that with her or expressed that he wanted to be DNR/DNI, states that at this time he is full code. [TT]  [phone] Discussed with ICU, given that his mental status was improving with the change to his BiPAP settings, will repeat VBG in 30 minutes, if VBG is worsening, he will likely need to be intubated, if improving and his mental status continues to improve, likely can go to hospitalist. [TT]  0519 Lactic Acid, Venous: 0.7 [TT]  0602 Blood gas, venous(!!) Repeat is improving compared to prior especially with the new BiPAP settings.  His mental status is still stable and not declining.  Discussed with ICU and he is stable for stepdown.  They will consult as pulmonology and will leave a note.  Will consult hospitalist for admission. [TT]  0602 Lactic Acid, Venous(!!): 2.6 Uptrending suspect this could be related to his respiratory failure and sepsis, he was initially given a small fluid bolus, given his BNP as well as trace lower extremity edema, will give  him slightly more fluids but will be judicious.  Chest x-ray does not show any pulmonary edema. [TT]    Clinical Course User Index [TT] Waymond Lorelle Cummins, MD      FINAL CLINICAL IMPRESSION(S) / ED DIAGNOSES   Final diagnoses:  COPD exacerbation (HCC)  Acute respiratory failure with hypoxia and hypercapnia (HCC)  Community acquired pneumonia, unspecified laterality  Sepsis, due to unspecified organism, unspecified whether acute organ dysfunction present [facility])     Rx / DC Orders   ED Discharge Orders     None        Note:  This document was prepared using Dragon voice recognition software and may include unintentional dictation errors.    Waymond Lorelle Cummins, MD 03/21/24 [id number]

## 2024-03-21 NOTE — Plan of Care (Signed)

## 2024-03-21 NOTE — ED Notes (Signed)
 Report given to ICU Rm 3 RN

## 2024-03-22 DIAGNOSIS — J9602 Acute respiratory failure with hypercapnia: Secondary | ICD-10-CM | POA: Diagnosis not present

## 2024-03-22 DIAGNOSIS — J9601 Acute respiratory failure with hypoxia: Secondary | ICD-10-CM | POA: Diagnosis not present

## 2024-03-22 LAB — BASIC METABOLIC PANEL WITH GFR
Anion gap: 10 (ref 5–15)
BUN: 22 mg/dL (ref 8–23)
CO2: 34 mmol/L — ABNORMAL HIGH (ref 22–32)
Calcium: 9.1 mg/dL (ref 8.9–10.3)
Chloride: 100 mmol/L (ref 98–111)
Creatinine, Ser: 0.48 mg/dL — ABNORMAL LOW (ref 0.61–1.24)
GFR, Estimated: >60 mL/min (ref >60)
Glucose, Bld: 158 mg/dL — ABNORMAL HIGH (ref 70–99)
Potassium: 4.4 mmol/L (ref 3.5–5.1)
Sodium: 144 mmol/L (ref 135–145)

## 2024-03-22 LAB — CBC
HCT: 32.8 % — ABNORMAL LOW (ref 39.0–52.0)
Hemoglobin: 10.5 g/dL — ABNORMAL LOW (ref 13.0–17.0)
MCH: 30.5 pg (ref 26.0–34.0)
MCHC: 32 g/dL (ref 30.0–36.0)
MCV: 95.3 fL (ref 80.0–100.0)
Platelets: 190 K/uL (ref 150–400)
RBC: 3.44 MIL/uL — ABNORMAL LOW (ref 4.22–5.81)
RDW: 15.1 % (ref 11.5–15.5)
WBC: 17.9 K/uL — ABNORMAL HIGH (ref 4.0–10.5)
nRBC: 0 % (ref 0.0–0.2)

## 2024-03-22 LAB — GLUCOSE, CAPILLARY
Glucose-Capillary: 114 mg/dL — ABNORMAL HIGH (ref 70–99)
Glucose-Capillary: 164 mg/dL — ABNORMAL HIGH (ref 70–99)
Glucose-Capillary: 122 mg/dL — ABNORMAL HIGH (ref 70–99)
Glucose-Capillary: 144 mg/dL — ABNORMAL HIGH (ref 70–99)

## 2024-03-22 LAB — HEMOGLOBIN A1C
Hgb A1c MFr Bld: 5.6 % (ref 4.8–5.6)
Mean Plasma Glucose: 114.02 mg/dL

## 2024-03-22 LAB — LACTIC ACID, PLASMA: Lactic Acid, Venous: 0.9 mmol/L (ref 0.5–1.9)

## 2024-03-22 MED ORDER — PREDNISONE 50 MG PO TABS: 25.0000 mg | ORAL_TABLET | Freq: Every day | ORAL | Status: DC

## 2024-03-22 MED ORDER — PREDNISONE 20 MG PO TABS: 40.0000 mg | ORAL_TABLET | Freq: Every day | ORAL | Status: DC

## 2024-03-22 MED ORDER — IBUPROFEN 400 MG PO TABS
400.0000 mg | ORAL_TABLET | Freq: Four times a day (QID) | ORAL | Status: DC | PRN
  Administered 2024-03-22: 400 mg via ORAL
  Filled 2024-03-22: qty 1

## 2024-03-22 MED ORDER — PREDNISONE 10 MG PO TABS: 10.0000 mg | ORAL_TABLET | Freq: Every day | ORAL | Status: DC

## 2024-03-22 MED ORDER — ORAL CARE MOUTH RINSE: 15.0000 mL | OROMUCOSAL | Status: DC | PRN

## 2024-03-22 MED ORDER — IPRATROPIUM-ALBUTEROL 0.5-2.5 (3) MG/3ML IN SOLN
3.0000 mL | Freq: Three times a day (TID) | RESPIRATORY_TRACT | Status: DC
  Administered 2024-03-22 – 2024-03-23 (×4): 3 mL via RESPIRATORY_TRACT
  Filled 2024-03-22 (×4): qty 3

## 2024-03-22 MED ORDER — AMOXICILLIN-POT CLAVULANATE 875-125 MG PO TABS
1.0000 | ORAL_TABLET | Freq: Two times a day (BID) | ORAL | Status: DC
  Administered 2024-03-23: 1 via ORAL
  Filled 2024-03-22: qty 1

## 2024-03-22 MED ORDER — PREDNISONE 50 MG PO TABS: 45.0000 mg | ORAL_TABLET | Freq: Every day | ORAL | Status: DC

## 2024-03-22 MED ORDER — PREDNISONE 50 MG PO TABS: 35.0000 mg | ORAL_TABLET | Freq: Every day | ORAL | Status: DC

## 2024-03-22 MED ORDER — PREDNISONE 10 MG PO TABS: 15.0000 mg | ORAL_TABLET | Freq: Every day | ORAL | Status: DC

## 2024-03-22 MED ORDER — PREDNISONE 20 MG PO TABS: 20.0000 mg | ORAL_TABLET | Freq: Every day | ORAL | Status: DC

## 2024-03-22 MED ORDER — PREDNISONE 50 MG PO TABS
50.0000 mg | ORAL_TABLET | Freq: Every day | ORAL | Status: AC
  Administered 2024-03-23: 50 mg via ORAL
  Filled 2024-03-22: qty 1

## 2024-03-22 MED ORDER — PREDNISONE 20 MG PO TABS: 30.0000 mg | ORAL_TABLET | Freq: Every day | ORAL | Status: DC

## 2024-03-22 MED ORDER — PREDNISONE 10 MG PO TABS: 5.0000 mg | ORAL_TABLET | Freq: Every day | ORAL | Status: DC

## 2024-03-22 MED ORDER — PREDNISONE 20 MG PO TABS: 50.0000 mg | ORAL_TABLET | Freq: Every day | ORAL | Status: DC
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
## 2024-03-22 NOTE — Progress Notes (Signed)
 Pt upset this morning. Pt wants to go home. Requesting to speak to his pulmonologist. Dr. Parris notified.

## 2024-03-22 NOTE — Progress Notes (Signed)
 Pt to be transferred to 1C-108. Report called to Avelina, Charity fundraiser. All questions answered. VSS at this time. Patient's sister present at bedside.
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
## 2024-03-22 NOTE — Plan of Care (Signed)
  Problem: Education: Goal: Knowledge of General Education information will improve Description: Including pain rating scale, medication(s)/side effects and non-pharmacologic comfort measures Outcome: Not Progressing   Problem: Health Behavior/Discharge Planning: Goal: Ability to manage health-related needs will improve Outcome: Not Progressing   Problem: Clinical Measurements: Goal: Respiratory complications will improve Outcome: Progressing Goal: Cardiovascular complication will be avoided Outcome: Progressing   Problem: Clinical Measurements: Goal: Cardiovascular complication will be avoided Outcome: Progressing   Problem: Activity: Goal: Risk for activity intolerance will decrease Outcome: Progressing

## 2024-03-22 NOTE — Plan of Care (Signed)
  Problem: Education: Goal: Knowledge of General Education information will improve Description: Including pain rating scale, medication(s)/side effects and non-pharmacologic comfort measures Outcome: Progressing   Problem: Health Behavior/Discharge Planning: Goal: Ability to manage health-related needs will improve Outcome: Progressing   Problem: Nutrition: Goal: Adequate nutrition will be maintained Outcome: Progressing   Problem: Pain Managment: Goal: General experience of comfort will improve and/or be controlled Outcome: Progressing   Problem: Safety: Goal: Ability to remain free from injury will improve Outcome: Progressing

## 2024-03-23 DIAGNOSIS — J9602 Acute respiratory failure with hypercapnia: Secondary | ICD-10-CM | POA: Diagnosis not present

## 2024-03-23 DIAGNOSIS — G9341 Metabolic encephalopathy: Secondary | ICD-10-CM | POA: Diagnosis present

## 2024-03-23 DIAGNOSIS — J9601 Acute respiratory failure with hypoxia: Secondary | ICD-10-CM | POA: Diagnosis not present

## 2024-03-23 LAB — GLUCOSE, CAPILLARY
Glucose-Capillary: 113 mg/dL — ABNORMAL HIGH (ref 70–99)
Glucose-Capillary: 223 mg/dL — ABNORMAL HIGH (ref 70–99)

## 2024-03-23 LAB — CBC
HCT: 34.1 % — ABNORMAL LOW (ref 39.0–52.0)
Hemoglobin: 10.7 g/dL — ABNORMAL LOW (ref 13.0–17.0)
MCH: 29.6 pg (ref 26.0–34.0)
MCHC: 31.4 g/dL (ref 30.0–36.0)
MCV: 94.2 fL (ref 80.0–100.0)
Platelets: 233 K/uL (ref 150–400)
RBC: 3.62 MIL/uL — ABNORMAL LOW (ref 4.22–5.81)
RDW: 14.8 % (ref 11.5–15.5)
WBC: 20.1 K/uL — ABNORMAL HIGH (ref 4.0–10.5)
nRBC: 0 % (ref 0.0–0.2)

## 2024-03-23 MED ORDER — PREDNISONE 10 MG PO TABS: ORAL_TABLET | ORAL | 0 refills | Status: AC

## 2024-03-23 MED ORDER — AMOXICILLIN-POT CLAVULANATE 875-125 MG PO TABS: 1.0000 | ORAL_TABLET | Freq: Two times a day (BID) | ORAL | 0 refills | Status: AC
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
## 2024-03-26 LAB — CULTURE, BLOOD (ROUTINE X 2)
Culture: NO GROWTH
Culture: NO GROWTH
Special Requests: ADEQUATE
Special Requests: ADEQUATE

## 2024-04-02 NOTE — Progress Notes (Addendum)
 TOC received a message from Company secretary advising the patient called because he hasn't received HH services. TOC reviewed the medical record, there is an order for home health PT/OT/Aide, but no note advising HH has been setup.   TOC sent referral to [facility]. Shaun with [facility] accepted the referral.

## 2024-04-03 NOTE — Progress Notes (Signed)
 TOC spoke with the patient's sister Donny  [phone] to advise [facility] will be reaching out to schedule the [facility] appointments.
